# Patient Record
Sex: Male | Born: 1975 | Race: Black or African American | Hispanic: No | Marital: Single | Smoking: Current every day smoker
Health system: Southern US, Community
[De-identification: ages and names within clinical notes are randomized; demographics above are authoritative.]

---

## 1999-03-26 ENCOUNTER — Emergency Department (HOSPITAL_COMMUNITY): Admission: EM | Admit: 1999-03-26 | Discharge: 1999-03-26 | Payer: Self-pay

## 1999-04-12 ENCOUNTER — Emergency Department (HOSPITAL_COMMUNITY): Admission: EM | Admit: 1999-04-12 | Discharge: 1999-04-12 | Payer: Self-pay

## 1999-04-12 ENCOUNTER — Encounter: Payer: Self-pay | Admitting: Emergency Medicine

## 2000-10-14 ENCOUNTER — Emergency Department (HOSPITAL_COMMUNITY): Admission: EM | Admit: 2000-10-14 | Discharge: 2000-10-14 | Payer: Self-pay | Admitting: Emergency Medicine

## 2001-06-13 ENCOUNTER — Emergency Department (HOSPITAL_COMMUNITY): Admission: EM | Admit: 2001-06-13 | Discharge: 2001-06-13 | Payer: Self-pay | Admitting: Emergency Medicine

## 2002-08-02 ENCOUNTER — Emergency Department (HOSPITAL_COMMUNITY): Admission: EM | Admit: 2002-08-02 | Discharge: 2002-08-02 | Payer: Self-pay | Admitting: Emergency Medicine

## 2002-10-11 ENCOUNTER — Emergency Department (HOSPITAL_COMMUNITY): Admission: EM | Admit: 2002-10-11 | Discharge: 2002-10-11 | Payer: Self-pay | Admitting: Emergency Medicine

## 2005-05-07 ENCOUNTER — Emergency Department (HOSPITAL_COMMUNITY): Admission: EM | Admit: 2005-05-07 | Discharge: 2005-05-07 | Payer: Self-pay | Admitting: Emergency Medicine

## 2005-05-09 ENCOUNTER — Emergency Department (HOSPITAL_COMMUNITY): Admission: EM | Admit: 2005-05-09 | Discharge: 2005-05-09 | Payer: Self-pay | Admitting: Emergency Medicine

## 2005-05-10 ENCOUNTER — Emergency Department (HOSPITAL_COMMUNITY): Admission: EM | Admit: 2005-05-10 | Discharge: 2005-05-10 | Payer: Self-pay | Admitting: Emergency Medicine

## 2005-06-10 ENCOUNTER — Emergency Department (HOSPITAL_COMMUNITY): Admission: EM | Admit: 2005-06-10 | Discharge: 2005-06-10 | Payer: Self-pay | Admitting: Emergency Medicine

## 2005-11-21 ENCOUNTER — Emergency Department (HOSPITAL_COMMUNITY): Admission: EM | Admit: 2005-11-21 | Discharge: 2005-11-21 | Payer: Self-pay | Admitting: Emergency Medicine

## 2006-08-02 ENCOUNTER — Emergency Department (HOSPITAL_COMMUNITY): Admission: EM | Admit: 2006-08-02 | Discharge: 2006-08-03 | Payer: Self-pay | Admitting: Emergency Medicine

## 2006-08-06 ENCOUNTER — Emergency Department (HOSPITAL_COMMUNITY): Admission: EM | Admit: 2006-08-06 | Discharge: 2006-08-06 | Payer: Self-pay | Admitting: Family Medicine

## 2006-12-06 ENCOUNTER — Emergency Department (HOSPITAL_COMMUNITY): Admission: EM | Admit: 2006-12-06 | Discharge: 2006-12-06 | Payer: Self-pay | Admitting: Family Medicine

## 2006-12-21 ENCOUNTER — Encounter: Admission: RE | Admit: 2006-12-21 | Discharge: 2007-03-21 | Payer: Self-pay | Admitting: Orthopedic Surgery

## 2007-06-07 ENCOUNTER — Emergency Department (HOSPITAL_COMMUNITY): Admission: EM | Admit: 2007-06-07 | Discharge: 2007-06-07 | Payer: Self-pay | Admitting: Emergency Medicine

## 2007-10-01 ENCOUNTER — Emergency Department (HOSPITAL_COMMUNITY): Admission: EM | Admit: 2007-10-01 | Discharge: 2007-10-01 | Payer: Self-pay | Admitting: Emergency Medicine

## 2007-10-16 ENCOUNTER — Emergency Department (HOSPITAL_COMMUNITY): Admission: EM | Admit: 2007-10-16 | Discharge: 2007-10-16 | Payer: Self-pay | Admitting: Emergency Medicine

## 2007-12-25 ENCOUNTER — Emergency Department (HOSPITAL_COMMUNITY): Admission: EM | Admit: 2007-12-25 | Discharge: 2007-12-26 | Payer: Self-pay | Admitting: Emergency Medicine

## 2008-12-07 ENCOUNTER — Emergency Department (HOSPITAL_COMMUNITY): Admission: EM | Admit: 2008-12-07 | Discharge: 2008-12-07 | Payer: Self-pay | Admitting: Emergency Medicine

## 2009-03-14 ENCOUNTER — Emergency Department (HOSPITAL_COMMUNITY): Admission: EM | Admit: 2009-03-14 | Discharge: 2009-03-14 | Payer: Self-pay | Admitting: Emergency Medicine

## 2009-06-06 ENCOUNTER — Emergency Department (HOSPITAL_COMMUNITY): Admission: EM | Admit: 2009-06-06 | Discharge: 2009-06-06 | Payer: Self-pay | Admitting: Family Medicine

## 2009-08-11 ENCOUNTER — Emergency Department (HOSPITAL_COMMUNITY): Admission: EM | Admit: 2009-08-11 | Discharge: 2009-08-11 | Payer: Self-pay | Admitting: Emergency Medicine

## 2009-11-15 ENCOUNTER — Emergency Department (HOSPITAL_BASED_OUTPATIENT_CLINIC_OR_DEPARTMENT_OTHER): Admission: EM | Admit: 2009-11-15 | Discharge: 2009-11-15 | Payer: Self-pay | Admitting: Emergency Medicine

## 2010-10-14 LAB — URINALYSIS, ROUTINE W REFLEX MICROSCOPIC
Bilirubin Urine: NEGATIVE
Hgb urine dipstick: NEGATIVE
Nitrite: NEGATIVE
Specific Gravity, Urine: 1.025 (ref 1.005–1.030)
pH: 6 (ref 5.0–8.0)

## 2010-10-14 LAB — BASIC METABOLIC PANEL
CO2: 29 mEq/L (ref 19–32)
GFR calc Af Amer: >60 mL/min (ref >60)
GFR calc non Af Amer: >60 mL/min (ref >60)
Glucose, Bld: 86 mg/dL (ref 70–99)
Potassium: 4.5 mEq/L (ref 3.5–5.1)
Sodium: 145 mEq/L (ref 135–145)

## 2011-03-25 ENCOUNTER — Emergency Department (HOSPITAL_COMMUNITY): Payer: Self-pay

## 2011-03-25 ENCOUNTER — Emergency Department (HOSPITAL_COMMUNITY)
Admission: EM | Admit: 2011-03-25 | Discharge: 2011-03-25 | Disposition: A | Discharge status: Home / Self Care | Payer: Self-pay | Attending: Emergency Medicine | Admitting: Emergency Medicine

## 2011-03-25 DIAGNOSIS — M7020 Olecranon bursitis, unspecified elbow: Secondary | ICD-10-CM | POA: Insufficient documentation

## 2011-03-25 DIAGNOSIS — M25429 Effusion, unspecified elbow: Secondary | ICD-10-CM | POA: Insufficient documentation

## 2011-03-25 DIAGNOSIS — M25529 Pain in unspecified elbow: Secondary | ICD-10-CM | POA: Insufficient documentation

## 2011-05-05 LAB — URINALYSIS, ROUTINE W REFLEX MICROSCOPIC
Bilirubin Urine: NEGATIVE
Glucose, UA: NEGATIVE
Hgb urine dipstick: NEGATIVE
Ketones, ur: NEGATIVE
Nitrite: NEGATIVE
Protein, ur: NEGATIVE
Specific Gravity, Urine: 1.022
Urobilinogen, UA: 1
pH: 6

## 2011-07-15 ENCOUNTER — Encounter: Payer: Self-pay | Admitting: Emergency Medicine

## 2011-07-15 ENCOUNTER — Emergency Department (HOSPITAL_COMMUNITY)
Admission: EM | Admit: 2011-07-15 | Discharge: 2011-07-15 | Discharge status: Against Medical Advice | Payer: Self-pay | Attending: *Deleted | Admitting: *Deleted

## 2011-07-15 DIAGNOSIS — M25529 Pain in unspecified elbow: Secondary | ICD-10-CM | POA: Insufficient documentation

## 2011-07-15 NOTE — ED Notes (Signed)
Pt sts injured right elbow 4 months ago and still c/o pain and sometimes waking up with numbness in right hand; pt denies new injury; CMS intact

## 2011-07-16 ENCOUNTER — Encounter (HOSPITAL_BASED_OUTPATIENT_CLINIC_OR_DEPARTMENT_OTHER): Payer: Self-pay

## 2011-07-16 ENCOUNTER — Emergency Department (HOSPITAL_BASED_OUTPATIENT_CLINIC_OR_DEPARTMENT_OTHER)
Admission: EM | Admit: 2011-07-16 | Discharge: 2011-07-16 | Disposition: A | Discharge status: Home / Self Care | Payer: Self-pay | Attending: Emergency Medicine | Admitting: Emergency Medicine

## 2011-07-16 DIAGNOSIS — F17200 Nicotine dependence, unspecified, uncomplicated: Secondary | ICD-10-CM | POA: Insufficient documentation

## 2011-07-16 DIAGNOSIS — M25529 Pain in unspecified elbow: Secondary | ICD-10-CM | POA: Insufficient documentation

## 2011-07-16 DIAGNOSIS — M25521 Pain in right elbow: Secondary | ICD-10-CM

## 2011-07-16 DIAGNOSIS — J45909 Unspecified asthma, uncomplicated: Secondary | ICD-10-CM | POA: Insufficient documentation

## 2011-07-16 MED ORDER — OXYCODONE-ACETAMINOPHEN 5-325 MG PO TABS
1.0000 | ORAL_TABLET | Freq: Once | ORAL | Status: AC
  Administered 2011-07-16: 1 via ORAL
  Filled 2011-07-16: qty 1

## 2011-07-16 MED ORDER — IBUPROFEN 600 MG PO TABS: 600.0000 mg | ORAL_TABLET | Freq: Four times a day (QID) | ORAL | Status: AC | PRN

## 2011-07-16 MED ORDER — OXYCODONE-ACETAMINOPHEN 5-325 MG PO TABS: 1.0000 | ORAL_TABLET | Freq: Four times a day (QID) | ORAL | Status: AC | PRN

## 2011-07-16 NOTE — ED Notes (Signed)
Injured right elbow 4 months ago-had pus/fluid drained at that time-c/o cont'd pain-worse x 1 month

## 2011-07-16 NOTE — ED Provider Notes (Signed)
History     CSN: [id number]  Arrival date & time 07/16/11  1753   First MD Initiated Contact with Patient 07/16/11 1820      Chief Complaint  Patient presents with  . Elbow Pain    (Consider location/radiation/quality/duration/timing/severity/associated sxs/prior treatment) The history is provided by the patient.   patient had right elbow pain about 4 months ago. He is seen in the ER and that might work for one had some fluid drained. He states over the last month the pain is returned and gotten worse. No more swelling up. 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 She's less some tingling in his hand 2. No new trauma. No new injury. No swelling.  Past Medical History  Diagnosis Date  . Asthma     History reviewed. No pertinent past surgical history.  No family history on file.  History  Substance Use Topics  . Smoking status: Current Everyday Smoker  . Smokeless tobacco: Not on file  . Alcohol Use: No      Review of Systems  Musculoskeletal: Negative for back pain and joint swelling.  Neurological: Positive for numbness.    Allergies  Almond oil; Aspirin; and Peanut-containing drug products  Home Medications   Current Outpatient Rx  Name Route Sig Dispense Refill  . ACETAMINOPHEN 500 MG PO TABS Oral Take 1,000 mg by mouth every 6 (six) hours as needed. For pain      . ALBUTEROL SULFATE HFA 108 (90 BASE) MCG/ACT IN AERS Inhalation Inhale 2 puffs into the lungs every 6 (six) hours as needed. For wheezing      . IBUPROFEN 600 MG PO TABS Oral Take 1 tablet (600 mg total) by mouth every 6 (six) hours as needed for pain. 20 tablet 0  . OXYCODONE-ACETAMINOPHEN 5-325 MG PO TABS Oral Take 1-2 tablets by mouth every 6 (six) hours as needed for pain. 20 tablet 0    BP 125/86  Pulse 64  Temp(Src) 97.5 F (36.4 C) (Oral)  Resp 16  Ht 6\' 5"  (1.956 m)  Wt 230 lb (104.327 kg)  BMI 27.27 kg/m2  SpO2 100%  Physical Exam  Constitutional: He appears  well-developed.  HENT:  Head: Normocephalic.  Musculoskeletal:       Range of motion intact in right elbow. No swelling. No tenderness. No effusion. Neurovascular intact distally.    ED Course  Procedures (including critical care time)  Labs Reviewed - No data to display No results found.   1. Elbow pain, right       MDM  Patient has recurrent right elbow pain previously had fluid drained. Does not appear ill or fluid collection now. He'll follow up with orthopedic        Juliet Rude. Rubin Payor, MD 07/16/11 [phone]

## 2011-10-11 ENCOUNTER — Emergency Department (HOSPITAL_BASED_OUTPATIENT_CLINIC_OR_DEPARTMENT_OTHER)
Admission: EM | Admit: 2011-10-11 | Discharge: 2011-10-11 | Disposition: A | Discharge status: Home / Self Care | Payer: Self-pay | Attending: Emergency Medicine | Admitting: Emergency Medicine

## 2011-10-11 ENCOUNTER — Emergency Department (INDEPENDENT_AMBULATORY_CARE_PROVIDER_SITE_OTHER): Payer: Self-pay

## 2011-10-11 ENCOUNTER — Encounter (HOSPITAL_BASED_OUTPATIENT_CLINIC_OR_DEPARTMENT_OTHER): Payer: Self-pay | Admitting: *Deleted

## 2011-10-11 DIAGNOSIS — J45901 Unspecified asthma with (acute) exacerbation: Secondary | ICD-10-CM | POA: Insufficient documentation

## 2011-10-11 DIAGNOSIS — R05 Cough: Secondary | ICD-10-CM

## 2011-10-11 DIAGNOSIS — R079 Chest pain, unspecified: Secondary | ICD-10-CM | POA: Insufficient documentation

## 2011-10-11 DIAGNOSIS — R0602 Shortness of breath: Secondary | ICD-10-CM | POA: Insufficient documentation

## 2011-10-11 DIAGNOSIS — R059 Cough, unspecified: Secondary | ICD-10-CM | POA: Insufficient documentation

## 2011-10-11 DIAGNOSIS — J45909 Unspecified asthma, uncomplicated: Secondary | ICD-10-CM

## 2011-10-11 DIAGNOSIS — J069 Acute upper respiratory infection, unspecified: Secondary | ICD-10-CM | POA: Insufficient documentation

## 2011-10-11 MED ORDER — ALBUTEROL SULFATE (5 MG/ML) 0.5% IN NEBU
5.0000 mg | INHALATION_SOLUTION | Freq: Once | RESPIRATORY_TRACT | Status: AC
  Administered 2011-10-11: 5 mg via RESPIRATORY_TRACT
  Filled 2011-10-11: qty 1

## 2011-10-11 MED ORDER — IPRATROPIUM BROMIDE 0.02 % IN SOLN
0.5000 mg | Freq: Once | RESPIRATORY_TRACT | Status: AC
  Administered 2011-10-11: 0.5 mg via RESPIRATORY_TRACT
  Filled 2011-10-11: qty 2.5

## 2011-10-11 MED ORDER — ALBUTEROL SULFATE HFA 108 (90 BASE) MCG/ACT IN AERS
2.0000 | INHALATION_SPRAY | RESPIRATORY_TRACT | Status: DC | PRN
  Administered 2011-10-11: 2 via RESPIRATORY_TRACT
  Filled 2011-10-11: qty 6.7

## 2011-10-11 MED ORDER — PREDNISONE 50 MG PO TABS
60.0000 mg | ORAL_TABLET | Freq: Once | ORAL | Status: AC
  Administered 2011-10-11: 60 mg via ORAL
  Filled 2011-10-11: qty 1

## 2011-10-11 MED ORDER — PREDNISONE 20 MG PO TABS: 60.0000 mg | ORAL_TABLET | Freq: Every day | ORAL | Status: AC

## 2011-10-11 MED ORDER — AEROCHAMBER PLUS W/MASK LARGE MISC
1.0000 | Freq: Once | Status: DC
  Filled 2011-10-11: qty 1

## 2011-10-11 NOTE — Discharge Instructions (Signed)

## 2011-10-11 NOTE — ED Provider Notes (Signed)
History     CSN: [id number]  Arrival date & time 10/11/11  0305   First MD Initiated Contact with Patient 10/11/11 [phone]      Chief Complaint  Patient presents with  . Shortness of Breath  . URI    (Consider location/radiation/quality/duration/timing/severity/associated sxs/prior treatment) HPI Patient is a 36 year old male who presents today complaining of increasing shortness of breath, cough, and chest tightness consistent with prior asthma attacks over the past 4 days. Patient is completely out of his albuterol inhaler as he has been using it every 4 hours. Patient cannot recall the last time he took prednisone. Patient is a smoker. Patient does not see Dr. for his asthma on a regular basis. He denies any chest pain. He's had a nonproductive cough that has not improved with Mucinex. Patient denies any nausea, vomiting, abdominal pain, urinary symptoms, or fever. He does endorse having generalized fatigue.There are no other associated or modifying factors.  Past Medical History  Diagnosis Date  . Asthma     History reviewed. No pertinent past surgical history.  History reviewed. No pertinent family history.  History  Substance Use Topics  . Smoking status: Current Everyday Smoker  . Smokeless tobacco: Not on file  . Alcohol Use: No      Review of Systems  Constitutional: Positive for fatigue.  HENT: Positive for congestion.   Eyes: Negative.   Respiratory: Positive for choking, chest tightness and shortness of breath.   Cardiovascular: Negative.   Gastrointestinal: Negative.   Genitourinary: Negative.   Musculoskeletal: Negative.   Skin: Negative.   Neurological: Negative.   Hematological: Negative.   Psychiatric/Behavioral: Negative.   All other systems reviewed and are negative.    Allergies  Almond oil; Aspirin; and Peanut-containing drug products  Home Medications   Current Outpatient Rx  Name Route Sig Dispense Refill  . ACETAMINOPHEN 500 MG PO TABS  Oral Take 1,000 mg by mouth every 6 (six) hours as needed. For pain      . ALBUTEROL SULFATE HFA 108 (90 BASE) MCG/ACT IN AERS Inhalation Inhale 2 puffs into the lungs every 6 (six) hours as needed. For wheezing      . PREDNISONE 20 MG PO TABS Oral Take 3 tablets (60 mg total) by mouth daily. 15 tablet 0    BP 141/76  Pulse 58  Temp(Src) 98.1 F (36.7 C) (Oral)  Resp 19  Ht 6\' 5"  (1.956 m)  Wt 235 lb (106.595 kg)  BMI 27.87 kg/m2  SpO2 96%  Physical Exam  Nursing note and vitals reviewed. GEN: Well-developed, well-nourished male in no distress HEENT: Atraumatic, normocephalic. Oropharynx clear without erythema EYES: PERRLA BL, no scleral icterus. NECK: Trachea midline, no meningismus CV: regular rate and rhythm. No murmurs, rubs, or gallops PULM: No respiratory distress.  Diminished breath sounds throughout. No crackles, wheezes, or rales.  GI: soft, non-tender. No guarding, rebound, or tenderness. + bowel sounds  GU: deferred Neuro: cranial nerves 2-12 intact, no abnormalities of strength or sensation, A and O x 3 MSK: Patient moves all 4 extremities symmetrically, no deformity, edema, or injury noted Skin: No rashes petechiae, purpura, or jaundice Psych: no abnormality of mood   ED Course  Procedures (including critical care time)  Labs Reviewed - No data to display Dg Chest 2 View  10/11/2011  *RADIOLOGY REPORT*  Clinical Data: Cough, shortness of breath; history of asthma.  CHEST - 2 VIEW  Comparison: Chest radiograph performed 08/11/2009  Findings: The lungs are well-aerated.  Mild chronic  peribronchial thickening is noted.  There is no evidence of focal opacification, pleural effusion or pneumothorax.  The heart is normal in size; the mediastinal contour is within normal limits.  No acute osseous abnormalities are seen.  IMPRESSION: No acute cardiopulmonary process seen.  Original Report Authenticated By: Tonia Ghent, M.D.     1. URI (upper respiratory infection)     2. Asthma exacerbation       MDM  Patient was evaluated by myself. Patient was very diminished on initial exam. He was given breathing treatments with albuterol and Atrovent as well as prednisone. A chest x-ray was performed it was negative. Patient was reassessed following breathing treatment and had significantly improved air movement. Patient was given an albuterol inhaler with AeroChamber to take home. He will also be discharged to 5 days of prednisone. Patient was advised to stop smoking. He also was advised to followup with a regular doctor to determine if he needs to be on inhaled steroids on a regular basis. Patient was discharged home in good condition.        Cyndra Numbers, MD 10/11/11 [phone]

## 2011-10-11 NOTE — ED Notes (Signed)
Pt presents to ED today with URI sx and associated asthma for the last 4 days.

## 2011-10-11 NOTE — ED Notes (Signed)
MD at bedside. 

## 2011-10-11 NOTE — ED Notes (Signed)
Pt states that he has been sick with a cold but has an hx of asthma but his albuterol inhaler doesn't seem to be working. Pt states that he uses MDI Q4 2-3 puff with no relief. Pt does not appear to be SHOB. Pt states that he does smoke and doesn't have any maintenance inhaler to help support the asthma tx.

## 2012-07-21 ENCOUNTER — Emergency Department (HOSPITAL_COMMUNITY)
Admission: EM | Admit: 2012-07-21 | Discharge: 2012-07-21 | Disposition: A | Discharge status: Home / Self Care | Payer: Self-pay | Attending: Emergency Medicine | Admitting: Emergency Medicine

## 2012-07-21 ENCOUNTER — Encounter (HOSPITAL_COMMUNITY): Payer: Self-pay | Admitting: *Deleted

## 2012-07-21 ENCOUNTER — Emergency Department (HOSPITAL_COMMUNITY): Payer: Self-pay

## 2012-07-21 DIAGNOSIS — F17200 Nicotine dependence, unspecified, uncomplicated: Secondary | ICD-10-CM | POA: Insufficient documentation

## 2012-07-21 DIAGNOSIS — Z79899 Other long term (current) drug therapy: Secondary | ICD-10-CM | POA: Insufficient documentation

## 2012-07-21 DIAGNOSIS — R51 Headache: Secondary | ICD-10-CM | POA: Insufficient documentation

## 2012-07-21 DIAGNOSIS — J45901 Unspecified asthma with (acute) exacerbation: Secondary | ICD-10-CM | POA: Insufficient documentation

## 2012-07-21 DIAGNOSIS — Z2089 Contact with and (suspected) exposure to other communicable diseases: Secondary | ICD-10-CM | POA: Insufficient documentation

## 2012-07-21 DIAGNOSIS — J3489 Other specified disorders of nose and nasal sinuses: Secondary | ICD-10-CM | POA: Insufficient documentation

## 2012-07-21 MED ORDER — ALBUTEROL SULFATE HFA 108 (90 BASE) MCG/ACT IN AERS: 1.0000 | INHALATION_SPRAY | Freq: Four times a day (QID) | RESPIRATORY_TRACT | Status: DC | PRN

## 2012-07-21 MED ORDER — BENZONATATE 100 MG PO CAPS: 100.0000 mg | ORAL_CAPSULE | Freq: Three times a day (TID) | ORAL | Status: DC

## 2012-07-21 MED ORDER — PREDNISONE 50 MG PO TABS: 50.0000 mg | ORAL_TABLET | Freq: Every day | ORAL | Status: DC

## 2012-07-21 MED ORDER — METHYLPREDNISOLONE SODIUM SUCC 125 MG IJ SOLR
125.0000 mg | Freq: Once | INTRAMUSCULAR | Status: AC
  Administered 2012-07-21: 125 mg via INTRAVENOUS
  Filled 2012-07-21: qty 2

## 2012-07-21 MED ORDER — IPRATROPIUM BROMIDE 0.02 % IN SOLN
0.5000 mg | Freq: Once | RESPIRATORY_TRACT | Status: AC
  Administered 2012-07-21: 0.5 mg via RESPIRATORY_TRACT
  Filled 2012-07-21: qty 2.5

## 2012-07-21 MED ORDER — ALBUTEROL SULFATE (5 MG/ML) 0.5% IN NEBU
5.0000 mg | INHALATION_SOLUTION | Freq: Once | RESPIRATORY_TRACT | Status: AC
  Administered 2012-07-21: 5 mg via RESPIRATORY_TRACT
  Filled 2012-07-21: qty 1

## 2012-07-21 NOTE — ED Notes (Signed)
MD at bedside. 

## 2012-07-21 NOTE — ED Provider Notes (Signed)
History  This chart was scribed for Loren Racer, MD by Bennett Scrape, ED Scribe. This patient was seen in room TR09C/TR09C and the patient's care was started at 1:53 PM.  CSN: [id number]  Arrival date & time 07/21/12  1339   First MD Initiated Contact with Patient 07/21/12 1353      Chief Complaint  Patient presents with  . Asthma    The history is provided by the patient. No language interpreter was used.    Richard Nielsen is a 36 y.o. male with a h/o asthma who presents to the Emergency Department complaining of 6 days of gradual onset, gradually worsening, constant cough productive of yellow phlegm with associated congestion, HA and wheezing that he attributes to asthma excerbation. He reports that he took 3 steroid pills left over from a prior prescription with no improvement in his symptoms and ran out of his inhaler 4 days ago. He states that the symptoms are similar to prior asthma triggers. He reports having several sick contacts with similar symptoms at home. He denies fevers, chills, nausea, emesis and diarrhea as associated symptoms. He is a current everyday smoker but denies alcohol use.    Past Medical History  Diagnosis Date  . Asthma     History reviewed. No pertinent past surgical history.  History reviewed. No pertinent family history.  History  Substance Use Topics  . Smoking status: Current Every Day Smoker  . Smokeless tobacco: Not on file  . Alcohol Use: No      Review of Systems  Constitutional: Negative for fever and chills.  HENT: Positive for congestion. Negative for sore throat.   Respiratory: Positive for cough and wheezing. Negative for chest tightness and shortness of breath.   Neurological: Positive for headaches. Negative for dizziness.  All other systems reviewed and are negative.    Allergies  Almond oil; Aspirin; and Peanut-containing drug products  Home Medications   Current Outpatient Rx  Name  Route  Sig  Dispense   Refill  . ALBUTEROL SULFATE HFA 108 (90 BASE) MCG/ACT IN AERS   Inhalation   Inhale 2 puffs into the lungs once. For wheezing          . ALBUTEROL SULFATE HFA 108 (90 BASE) MCG/ACT IN AERS   Inhalation   Inhale 1-2 puffs into the lungs every 6 (six) hours as needed for wheezing.   1 Inhaler   0   . BENZONATATE 100 MG PO CAPS   Oral   Take 1 capsule (100 mg total) by mouth every 8 (eight) hours.   21 capsule   0   . PREDNISONE 50 MG PO TABS   Oral   Take 1 tablet (50 mg total) by mouth daily.   5 tablet   0     Triage Vitals: BP 155/83  Pulse 75  Temp 98.3 F (36.8 C) (Oral)  Resp 24  Ht 6\' 5"  (1.956 m)  SpO2 96%  Physical Exam  Nursing note and vitals reviewed. Constitutional: He is oriented to person, place, and time. He appears well-developed and well-nourished. No distress.  HENT:  Head: Normocephalic and atraumatic.  Mouth/Throat: Oropharynx is clear and moist.  Eyes: Conjunctivae normal and EOM are normal.  Neck: Normal range of motion. Neck supple. No tracheal deviation present.  Cardiovascular: Normal rate and regular rhythm.   Pulmonary/Chest: Effort normal. No respiratory distress. He has wheezes (expiratory).  Musculoskeletal: Normal range of motion.  Neurological: He is alert and oriented to person, place,  and time.  Skin: Skin is warm and dry.  Psychiatric: He has a normal mood and affect. His behavior is normal.    ED Course  Procedures (including critical care time)  DIAGNOSTIC STUDIES: Oxygen Saturation is 96% on room air, adequate by my interpretation.    COORDINATION OF CARE: 1:58 PM- Discussed treatment plan which includes CXR and a breathing treatment with pt at bedside and pt agreed to plan.  2:15 PM- Ordered 125 mg solumedrol injection, 5 mg of 0.5% albuterol nebulizer solution and 0.5 mg of Atrovent solution.  4:15 PM- Pt rechecked and feels improved. Upon re-exam, lungs are clear to ausculation. Advised pt of radiology results.  Discussed discharge plan which includes prednisone, albuterol inhaler and tessalon with pt and pt agreed to plan. Also advised pt to follow up with PCP and pt agreed.  4:20 PM- Prescribed 50 mg prednisone tablets, albuterol inhaler and 100 mg tessalon capsules.  .Labs Reviewed - No data to display Dg Chest 2 View  07/21/2012  *RADIOLOGY REPORT*  Clinical Data: Shortness of breath, productive cough  CHEST - 2 VIEW  Comparison: 10/11/2011  Findings: The heart size and vascular pattern are normal.  Lungs are clear.  There is no change from prior study.  IMPRESSION: Normal chest radiographs.   Original Report Authenticated By: Esperanza Heir, M.D.      1. Asthma exacerbation       MDM  I personally performed the services described in this documentation, which was scribed in my presence. The recorded information has been reviewed and is accurate.    Loren Racer, MD 07/21/12 1944

## 2012-07-21 NOTE — ED Notes (Signed)
t has hx of asthma.  Reports that he is having a flare up.  Pt speaking incomplete sentences, breathing nonlabored.  Reports that he hasnt had an inhaler is 6 months.  Pt noted to be coughing. States that he has had some congestion and HA.  NAD noted.

## 2013-11-01 ENCOUNTER — Emergency Department (HOSPITAL_COMMUNITY): Payer: Self-pay

## 2013-11-01 ENCOUNTER — Emergency Department (HOSPITAL_COMMUNITY)
Admission: EM | Admit: 2013-11-01 | Discharge: 2013-11-01 | Disposition: A | Discharge status: Home / Self Care | Payer: Self-pay | Attending: Emergency Medicine | Admitting: Emergency Medicine

## 2013-11-01 ENCOUNTER — Encounter (HOSPITAL_COMMUNITY): Payer: Self-pay | Admitting: Emergency Medicine

## 2013-11-01 DIAGNOSIS — Z79899 Other long term (current) drug therapy: Secondary | ICD-10-CM | POA: Insufficient documentation

## 2013-11-01 DIAGNOSIS — S62009A Unspecified fracture of navicular [scaphoid] bone of unspecified wrist, initial encounter for closed fracture: Secondary | ICD-10-CM | POA: Insufficient documentation

## 2013-11-01 DIAGNOSIS — F17200 Nicotine dependence, unspecified, uncomplicated: Secondary | ICD-10-CM | POA: Insufficient documentation

## 2013-11-01 DIAGNOSIS — S6990XA Unspecified injury of unspecified wrist, hand and finger(s), initial encounter: Secondary | ICD-10-CM | POA: Insufficient documentation

## 2013-11-01 DIAGNOSIS — R296 Repeated falls: Secondary | ICD-10-CM | POA: Insufficient documentation

## 2013-11-01 DIAGNOSIS — S8263XA Displaced fracture of lateral malleolus of unspecified fibula, initial encounter for closed fracture: Secondary | ICD-10-CM | POA: Insufficient documentation

## 2013-11-01 DIAGNOSIS — Y9239 Other specified sports and athletic area as the place of occurrence of the external cause: Secondary | ICD-10-CM | POA: Insufficient documentation

## 2013-11-01 DIAGNOSIS — S6980XA Other specified injuries of unspecified wrist, hand and finger(s), initial encounter: Secondary | ICD-10-CM | POA: Insufficient documentation

## 2013-11-01 DIAGNOSIS — S8253XA Displaced fracture of medial malleolus of unspecified tibia, initial encounter for closed fracture: Secondary | ICD-10-CM | POA: Insufficient documentation

## 2013-11-01 DIAGNOSIS — Y9367 Activity, basketball: Secondary | ICD-10-CM | POA: Insufficient documentation

## 2013-11-01 DIAGNOSIS — Y92838 Other recreation area as the place of occurrence of the external cause: Secondary | ICD-10-CM

## 2013-11-01 DIAGNOSIS — J45909 Unspecified asthma, uncomplicated: Secondary | ICD-10-CM | POA: Insufficient documentation

## 2013-11-01 MED ORDER — ONDANSETRON 4 MG PO TBDP
4.0000 mg | ORAL_TABLET | Freq: Once | ORAL | Status: AC
  Administered 2013-11-01: 4 mg via ORAL
  Filled 2013-11-01: qty 1

## 2013-11-01 MED ORDER — OXYCODONE-ACETAMINOPHEN 5-325 MG PO TABS: 1.0000 | ORAL_TABLET | Freq: Four times a day (QID) | ORAL | Status: DC | PRN

## 2013-11-01 MED ORDER — NAPROXEN 500 MG PO TABS: 500.0000 mg | ORAL_TABLET | Freq: Two times a day (BID) | ORAL | Status: DC

## 2013-11-01 MED ORDER — OXYCODONE-ACETAMINOPHEN 5-325 MG PO TABS
1.0000 | ORAL_TABLET | Freq: Once | ORAL | Status: AC
Start: 2013-11-01 — End: 2013-11-01
  Administered 2013-11-01: 1 via ORAL
  Filled 2013-11-01: qty 1

## 2013-11-01 NOTE — ED Notes (Signed)
Ortho at bedside.

## 2013-11-01 NOTE — ED Notes (Signed)
Ortho paged. 

## 2013-11-01 NOTE — ED Notes (Signed)
Ortho at bedside to place splint and cam walker.

## 2013-11-01 NOTE — ED Provider Notes (Signed)
CSN: [id number]     Arrival date & time 11/01/13  1059 History   First MD Initiated Contact with Patient 11/01/13 1112    This chart was scribed for Richard Nielsen, a non-physician practitioner working with No att. providers found by Lewanda Rife, ED Scribe. This patient was seen in room TR08C/TR08C and the patient's care was started at 7:03 PM     Chief Complaint  Patient presents with  . Ankle Pain  . Foot Pain     (Consider location/radiation/quality/duration/timing/severity/associated sxs/prior Treatment) The history is provided by the patient. No language interpreter was used.   HPI Comments: Richard Nielsen is a 38 y.o. male who presents to the Emergency Department complaining of constant right ankle pain onset last night while playing basketball with his son. He also remembers breaking his fall with right hand. Describes pain as moderate in severity. States he was playing basketball when his son last night and son stepped onto his right foot causing him to fall. Reports associate right foot pain, right wrist pain, and antalgic gait. States pain is exacerbated by touch, gripping motion of right hand, and movement. Denies any alleviating factors. Denies associated numbness, weakness, head injury, and LOC.  Past Medical History  Diagnosis Date  . Asthma    History reviewed. No pertinent past surgical history. No family history on file. History  Substance Use Topics  . Smoking status: Current Every Day Smoker -- 0.50 packs/day    Types: Cigarettes  . Smokeless tobacco: Not on file  . Alcohol Use: Yes    Review of Systems  Constitutional: Negative for fever.  Musculoskeletal: Positive for myalgias.  Neurological: Negative for weakness and numbness.  Psychiatric/Behavioral: Negative for confusion.      Allergies  Almond oil; Aspirin; and Peanut-containing drug products  Home Medications   Current Outpatient Rx  Name  Route  Sig  Dispense  Refill  . ibuprofen  (ADVIL,MOTRIN) 800 MG tablet   Oral   Take 800 mg by mouth every 8 (eight) hours as needed (pain).         Marland Kitchen albuterol (PROVENTIL HFA;VENTOLIN HFA) 108 (90 BASE) MCG/ACT inhaler   Inhalation   Inhale 1-2 puffs into the lungs every 6 (six) hours as needed (asthma). For wheezing          . diphenhydramine-acetaminophen (TYLENOL PM) 25-500 MG TABS   Oral   Take 2 tablets by mouth once as needed (pain).         . naproxen (NAPROSYN) 500 MG tablet   Oral   Take 1 tablet (500 mg total) by mouth 2 (two) times daily.   30 tablet   0   . oxyCODONE-acetaminophen (PERCOCET/ROXICET) 5-325 MG per tablet   Oral   Take 1-2 tablets by mouth every 6 (six) hours as needed for severe pain.   25 tablet   0    BP 118/75  Pulse 101  Temp(Src) 98.4 F (36.9 C) (Oral)  Resp 18  Ht 6\' 5"  (1.956 m)  Wt 225 lb (102.059 kg)  BMI 26.68 kg/m2  SpO2 98% Physical Exam  Nursing note and vitals reviewed. Constitutional: He is oriented to person, place, and time. He appears well-developed and well-nourished. No distress.  HENT:  Head: Normocephalic and atraumatic.  Eyes: EOM are normal.  Neck: Neck supple. No tracheal deviation present.  Cardiovascular: Normal rate.   Pulmonary/Chest: Effort normal. No respiratory distress.  Musculoskeletal: Normal range of motion.  Right hand: decreased ROM of right  thumb  secondary to pain, swelling over thenar eminence, full ROM of finger 2-5, neurovascularly intact in all five fingers. No pain or decreased ROM right wrist. Right elbow and right shoulder is normal.   Right ankle: Full ROM with pain, diffuse swelling, neurovascularly intact to all five toes, and no swelling of calf  Neurological: He is alert and oriented to person, place, and time.  Skin: Skin is warm and dry.  Psychiatric: He has a normal mood and affect. His behavior is normal.    ED Course  Procedures (including critical care time) COORDINATION OF CARE:  Nursing notes reviewed. Vital  signs reviewed. Initial pt interview and examination performed.   Filed Vitals:   11/01/13 1119 11/01/13 1349  BP: 127/72 118/75  Pulse: 50 101  Temp: 98.4 F (36.9 C)   TempSrc: Oral   Resp: 18   Height: 6\' 5"  (1.956 m)   Weight: 225 lb (102.059 kg)   SpO2: 98% 98%    7:03 PM-Discussed work up plan with pt at bedside, which includes  Orders Placed This Encounter  Procedures  . DG Ankle Complete Right    Standing Status: Standing     Number of Occurrences: 1     Standing Expiration Date:     Order Specific Question:  Reason for exam:    Answer:  ANKLE PAIN    Order Specific Question:  Reason for exam:    Answer:  FOOT PAIN  . DG Wrist Complete Right    Standing Status: Standing     Number of Occurrences: 1     Standing Expiration Date:     Order Specific Question:  Reason for exam:    Answer:  ANKLE PAIN    Order Specific Question:  Reason for exam:    Answer:  FOOT PAIN  . DG Foot Complete Right    Standing Status: Standing     Number of Occurrences: 1     Standing Expiration Date:     Order Specific Question:  Reason for exam:    Answer:  ANKLE PAIN    Order Specific Question:  Reason for exam:    Answer:  FOOT PAIN  . Apply cam walker    Standing Status: Standing     Number of Occurrences: 1     Standing Expiration Date:     Order Specific Question:  Laterality    Answer:  Right  . Apply splint short arm    THUMB SPICA    Standing Status: Standing     Number of Occurrences: 1     Standing Expiration Date:     Order Specific Question:  Laterality    Answer:  Right  . Pt agrees with plan.   Treatment plan initiated: Medications  oxyCODONE-acetaminophen (PERCOCET/ROXICET) 5-325 MG per tablet 1 tablet (1 tablet Oral Given 11/01/13 1324)  ondansetron (ZOFRAN-ODT) disintegrating tablet 4 mg (4 mg Oral Given 11/01/13 1324)     Initial diagnostic testing ordered.       Labs Review Labs Reviewed - No data to display Imaging Review Dg Wrist Complete  Right  11/01/2013   CLINICAL DATA:  Pain post trauma  EXAM: RIGHT WRIST - COMPLETE 3+ VIEW  COMPARISON:  None.  FINDINGS: Frontal, oblique, lateral, and ulnar deviation scaphoid images were obtained. On the oblique view, there is a fracture along the lateral distal scaphoid in near anatomic alignment. No other fracture. No dislocation. Joint spaces appear intact.  IMPRESSION: Fracture along the lateral aspect of the distal scaphoid  bone. No other abnormality noted.   Electronically Signed   By: Bretta Bang M.D.   On: 11/01/2013 12:38   Dg Ankle Complete Right  11/01/2013   CLINICAL DATA:  Pain post trauma  EXAM: RIGHT ANKLE - COMPLETE 3+ VIEW  COMPARISON:  None.  FINDINGS: Frontal, oblique, and lateral views were obtained. There is generalized soft tissue swelling. There is a small avulsion arising off the medial aspect of the medial malleolus. There is also an avulsion arising from the inferior aspect of the lateral malleolus.  No other fractures are appreciated. Ankle mortise appears intact. No appreciable joint effusion.  IMPRESSION: Small avulsions arising from the medial and lateral malleolar regions. Mortise appears intact.   Electronically Signed   By: Bretta Bang M.D.   On: 11/01/2013 12:36   Dg Foot Complete Right  11/01/2013   CLINICAL DATA:  Pain post trauma  EXAM: RIGHT FOOT COMPLETE - 3+ VIEW  COMPARISON:  None.  FINDINGS: Frontal, oblique, and lateral views were obtained. There is flexion of the second PIP and DIP joints. There is no frank dislocation. No fracture. There is no appreciable joint space narrowing. No erosive change.  IMPRESSION: Persistent flexion of the second PIP and DIP joints. No acute fracture or dislocation.   Electronically Signed   By: Bretta Bang M.D.   On: 11/01/2013 12:37   7:03 PM Nursing Notes Reviewed/ Care Coordinated Applicable Imaging Reviewed and incorporated into ED treatment Discussed results and treatment plan with pt.  Pt demonstrates  understanding and agrees with plan.  7:03 PM Consulted with Dr. Dion Saucier who agrees with plan of cam walker for right foot and thumb spica for right hand. States he will manage scaphoid fracture initially, but will refer to hand if he feels it is more complex.   EKG Interpretation None      MDM   Final diagnoses:  Scaphoid fracture of wrist  Lateral malleolar fracture  Medial malleolar fracture   38 y.o.Salvatore Decent Henion's evaluation in the Emergency Department is complete. It has been determined that no acute conditions requiring further emergency intervention are present at this time. The patient/guardian have been advised of the diagnosis and plan. We have discussed signs and symptoms that warrant return to the ED, such as changes or worsening in symptoms.  Vital signs are stable at discharge. Filed Vitals:   11/01/13 1349  BP: 118/75  Pulse: 101  Temp:   Resp:     Patient/guardian has voiced understanding and agreed to follow-up with the PCP or specialist.  I personally performed the services described in this documentation, which was scribed in my presence. The recorded information has been reviewed and is accurate.   Dorthula Matas, PA-C 11/01/13 1904

## 2013-11-01 NOTE — ED Notes (Signed)
Patient states he was playing basket ball with his son last night and he landed wrong on the R foot/ankle.  He states his son fell on it.   When breaking the fall, he hurt his R wrist also.   Swelling noted to both.

## 2013-11-01 NOTE — Progress Notes (Signed)
Orthopedic Tech Progress Note Patient Details:  Richard HartsSidney B Nielsen Dec 16, 1975 [id number]  Ortho Devices Type of Ortho Device: CAM walker;Thumb spica splint Ortho Device/Splint Location: rue/rle Ortho Device/Splint Interventions: Application   Richard Nielsen 11/01/2013, 1:44 PM

## 2013-11-01 NOTE — ED Provider Notes (Signed)
Medical screening examination/treatment/procedure(s) were performed by non-physician practitioner and as supervising physician I was immediately available for consultation/collaboration.   EKG Interpretation None        Junius ArgyleForrest S Samik Balkcom, MD 11/01/13 2144

## 2013-11-01 NOTE — Discharge Instructions (Signed)
Ankle Fracture A fracture is a break in the bone. A cast or splint is used to protect and keep your injured bone from moving.  HOME CARE INSTRUCTIONS   Use your crutches as directed.  To lessen the swelling, keep the injured leg elevated while sitting or lying down.  Apply ice to the injury for 15-20 minutes, 03-04 times per day while awake for 2 days. Put the ice in a plastic bag and place a thin towel between the bag of ice and your cast.  If you have a plaster or fiberglass cast:  Do not try to scratch the skin under the cast using sharp or pointed objects.  Check the skin around the cast every day. You may put lotion on any red or sore areas.  Keep your cast dry and clean.  If you have a plaster splint:  Wear the splint as directed.  You may loosen the elastic around the splint if your toes become numb, tingle, or turn cold or blue.  Do not put pressure on any part of your cast or splint; it may break. Rest your cast only on a pillow the first 24 hours until it is fully hardened.  Your cast or splint can be protected during bathing with a plastic bag. Do not lower the cast or splint into water.  Take medications as directed by your caregiver. Only take over-the-counter or prescription medicines for pain, discomfort, or fever as directed by your caregiver.  Do not drive a vehicle until your caregiver specifically tells you it is safe to do so.  If your caregiver has given you a follow-up appointment, it is very important to keep that appointment. Not keeping the appointment could result in a chronic or permanent injury, pain, and disability. If there is any problem keeping the appointment, you must call back to this facility for assistance. SEEK IMMEDIATE MEDICAL CARE IF:   Your cast gets damaged or breaks.  You have continued severe pain or more swelling than you did before the cast was put on.  Your skin or toenails below the injury turn blue or gray, or feel cold or  numb.  There is a bad smell or new stains and/or purulent (pus like) drainage coming from under the cast. If you do not have a window in your cast for observing the wound, a discharge or minor bleeding may show up as a stain on the outside of your cast. Report these findings to your caregiver. MAKE SURE YOU:   Understand these instructions.  Will watch your condition.  Will get help right away if you are not doing well or get worse. Document Released: 07/10/2000 Document Revised: 10/05/2011 Document Reviewed: 02/09/2013 [facility] Patient Information 2014 [place], [place].    Cast or Splint Care Casts and splints support injured limbs and keep bones from moving while they heal. It is important to care for your cast or splint at home.  HOME CARE INSTRUCTIONS  Keep the cast or splint uncovered during the drying period. It can take 24 to 48 hours to dry if it is made of plaster. A fiberglass cast will dry in less than 1 hour.  Do not rest the cast on anything harder than a pillow for the first 24 hours.  Do not put weight on your injured limb or apply pressure to the cast until your health care provider gives you permission.  Keep the cast or splint dry. Wet casts or splints can lose their shape and may not support  the limb as well. A wet cast that has lost its shape can also create harmful pressure on your skin when it dries. Also, wet skin can become infected.  Cover the cast or splint with a plastic bag when bathing or when out in the rain or snow. If the cast is on the trunk of the body, take sponge baths until the cast is removed.  If your cast does become wet, dry it with a towel or a blow dryer on the cool setting only.  Keep your cast or splint clean. Soiled casts may be wiped with a moistened cloth.  Do not place any hard or soft foreign objects under your cast or splint, such as cotton, toilet paper, lotion, or powder.  Do not try to scratch the skin under the cast with any  object. The object could get stuck inside the cast. Also, scratching could lead to an infection. If itching is a problem, use a blow dryer on a cool setting to relieve discomfort.  Do not trim or cut your cast or remove padding from inside of it.  Exercise all joints next to the injury that are not immobilized by the cast or splint. For example, if you have a long leg cast, exercise the hip joint and toes. If you have an arm cast or splint, exercise the shoulder, elbow, thumb, and fingers.  Elevate your injured arm or leg on 1 or 2 pillows for the first 1 to 3 days to decrease swelling and pain.It is best if you can comfortably elevate your cast so it is higher than your heart. SEEK MEDICAL CARE IF:   Your cast or splint cracks.  Your cast or splint is too tight or too loose.  You have unbearable itching inside the cast.  Your cast becomes wet or develops a soft spot or area.  You have a bad smell coming from inside your cast.  You get an object stuck under your cast.  Your skin around the cast becomes red or raw.  You have new pain or worsening pain after the cast has been applied. SEEK IMMEDIATE MEDICAL CARE IF:   You have fluid leaking through the cast.  You are unable to move your fingers or toes.  You have discolored (blue or white), cool, painful, or very swollen fingers or toes beyond the cast.  You have tingling or numbness around the injured area.  You have severe pain or pressure under the cast.  You have any difficulty with your breathing or have shortness of breath.  You have chest pain. Document Released: 07/10/2000 Document Revised: 05/03/2013 Document Reviewed: 01/19/2013 [facility] Patient Information 2014 [place], [place].   Scaphoid Fracture, Wrist A fracture is a break in the bone. The bone you have broken often does not show up as a fracture on x-ray until later on in the healing phase. This bone is called the scaphoid bone. With this bone, your caregiver  will often cast or splint your wrist as though it is fractured, even if a fracture is not seen on the x-ray. This is often done with wrist injuries in which there is tenderness at the base of the thumb. An x-ray at 1-3 weeks after your injury may confirm this fracture. A cast or splint is used to protect and keep your injured bone in good position for healing. The cast or splint will be on generally for about 6 to 16 weeks, depending on your health, age, the fracture location and how quickly you  heal. Another name for the scaphoid bone is the navicular bone. HOME CARE INSTRUCTIONS   To lessen the swelling and pain, keep the injured part elevated above your heart while sitting or lying down.  Apply ice to the injury for 15-20 minutes, 03-04 times per day while awake, for 2 days. Put the ice in a plastic bag and place a thin towel between the bag of ice and your cast.  If you have a plaster or fiberglass cast or splint:  Do not try to scratch the skin under the cast using sharp or pointed objects.  Check the skin around the cast every day. You may put lotion on any red or sore areas.  Keep your cast or splint dry and clean.  If you have a plaster splint:  Wear the splint as directed.  You may loosen the elastic bandage around the splint if your fingers become numb, tingle, or turn cold or blue.  If you have been put in a removable splint, wear and use as directed.  Do not put pressure on any part of your cast or splint; it may deform or break. Rest your cast or splint only on a pillow the first 24 hours until it is fully hardened.  Your cast or splint can be protected during bathing with a plastic bag. Do not lower the cast or splint into water.  Only take over-the-counter or prescription medicines for pain, discomfort, or fever as directed by your caregiver.  If your caregiver has given you a follow up appointment, it is very important to keep that appointment. Not keeping the appointment  could result in chronic pain and decreased function. If there is any problem keeping the appointment, you must call back to this facility for assistance. SEEK IMMEDIATE MEDICAL CARE IF:   Your cast gets damaged, wet or breaks.  You have continued severe pain or more swelling than you did before the cast or splint was put on.  Your skin or nails below the injury turn blue or gray, or feel cold or numb.  You have tingling or burning pain in your fingers or increasing pain with movement of your fingers Document Released: 07/03/2002 Document Revised: 10/05/2011 Document Reviewed: 03/01/2009 [facility] Patient Information 2014 [place].

## 2013-11-01 NOTE — ED Notes (Signed)
Tiffany PA at bedside  

## 2015-01-13 ENCOUNTER — Emergency Department (HOSPITAL_COMMUNITY): Payer: Self-pay

## 2015-01-13 ENCOUNTER — Emergency Department (HOSPITAL_COMMUNITY)
Admission: EM | Admit: 2015-01-13 | Discharge: 2015-01-13 | Disposition: A | Discharge status: Home / Self Care | Payer: Self-pay | Attending: Emergency Medicine | Admitting: Emergency Medicine

## 2015-01-13 ENCOUNTER — Encounter (HOSPITAL_COMMUNITY): Payer: Self-pay | Admitting: Emergency Medicine

## 2015-01-13 DIAGNOSIS — N201 Calculus of ureter: Secondary | ICD-10-CM

## 2015-01-13 DIAGNOSIS — J45909 Unspecified asthma, uncomplicated: Secondary | ICD-10-CM | POA: Insufficient documentation

## 2015-01-13 DIAGNOSIS — N132 Hydronephrosis with renal and ureteral calculous obstruction: Secondary | ICD-10-CM | POA: Insufficient documentation

## 2015-01-13 DIAGNOSIS — Z720 Tobacco use: Secondary | ICD-10-CM | POA: Insufficient documentation

## 2015-01-13 LAB — URINALYSIS, ROUTINE W REFLEX MICROSCOPIC
Bilirubin Urine: NEGATIVE
Glucose, UA: NEGATIVE mg/dL
Ketones, ur: NEGATIVE mg/dL
NITRITE: NEGATIVE
PH: 7.5 (ref 5.0–8.0)
PROTEIN: 30 mg/dL — AB
SPECIFIC GRAVITY, URINE: 1.046 — AB (ref 1.005–1.030)
Urobilinogen, UA: 0.2 mg/dL (ref 0.0–1.0)

## 2015-01-13 LAB — CBC WITH DIFFERENTIAL/PLATELET
BASOS PCT: 0 % (ref 0–1)
Basophils Absolute: 0 10*3/uL (ref 0.0–0.1)
EOS ABS: 0.2 10*3/uL (ref 0.0–0.7)
Eosinophils Relative: 2 % (ref 0–5)
HEMATOCRIT: 44.9 % (ref 39.0–52.0)
Hemoglobin: 16.4 g/dL (ref 13.0–17.0)
Lymphocytes Relative: 38 % (ref 12–46)
Lymphs Abs: 4.1 10*3/uL — ABNORMAL HIGH (ref 0.7–4.0)
MCH: 32.5 pg (ref 26.0–34.0)
MCHC: 36.5 g/dL — AB (ref 30.0–36.0)
MCV: 88.9 fL (ref 78.0–100.0)
Monocytes Absolute: 1 10*3/uL (ref 0.1–1.0)
Monocytes Relative: 9 % (ref 3–12)
Neutro Abs: 5.5 10*3/uL (ref 1.7–7.7)
Neutrophils Relative %: 51 % (ref 43–77)
Platelets: 171 10*3/uL (ref 150–400)
RBC: 5.05 MIL/uL (ref 4.22–5.81)
RDW: 13.6 % (ref 11.5–15.5)
WBC: 10.7 10*3/uL — AB (ref 4.0–10.5)

## 2015-01-13 LAB — COMPREHENSIVE METABOLIC PANEL
ALK PHOS: 65 U/L (ref 38–126)
ALT: 18 U/L (ref 17–63)
AST: 30 U/L (ref 15–41)
Albumin: 3.9 g/dL (ref 3.5–5.0)
Anion gap: 10 (ref 5–15)
BILIRUBIN TOTAL: 0.6 mg/dL (ref 0.3–1.2)
BUN: 11 mg/dL (ref 6–20)
CHLORIDE: 106 mmol/L (ref 101–111)
CO2: 23 mmol/L (ref 22–32)
CREATININE: 1.15 mg/dL (ref 0.61–1.24)
Calcium: 9.3 mg/dL (ref 8.9–10.3)
Glucose, Bld: 130 mg/dL — ABNORMAL HIGH (ref 65–99)
POTASSIUM: 3.6 mmol/L (ref 3.5–5.1)
SODIUM: 139 mmol/L (ref 135–145)
Total Protein: 7.6 g/dL (ref 6.5–8.1)

## 2015-01-13 LAB — URINE MICROSCOPIC-ADD ON

## 2015-01-13 LAB — LIPASE, BLOOD: LIPASE: 34 U/L (ref 22–51)

## 2015-01-13 MED ORDER — OXYCODONE-ACETAMINOPHEN 5-325 MG PO TABS: 2.0000 | ORAL_TABLET | ORAL | Status: DC | PRN

## 2015-01-13 MED ORDER — HYDROMORPHONE HCL 1 MG/ML IJ SOLN
1.0000 mg | Freq: Once | INTRAMUSCULAR | Status: AC
  Administered 2015-01-13: 1 mg via INTRAVENOUS
  Filled 2015-01-13: qty 1

## 2015-01-13 MED ORDER — IOHEXOL 300 MG/ML  SOLN
100.0000 mL | Freq: Once | INTRAMUSCULAR | Status: AC | PRN
  Administered 2015-01-13: 100 mL via INTRAVENOUS

## 2015-01-13 MED ORDER — SODIUM CHLORIDE 0.9 % IV BOLUS (SEPSIS)
1000.0000 mL | Freq: Once | INTRAVENOUS | Status: AC
  Administered 2015-01-13: 1000 mL via INTRAVENOUS

## 2015-01-13 MED ORDER — IOHEXOL 300 MG/ML  SOLN: 25.0000 mL | Freq: Once | INTRAMUSCULAR | Status: DC | PRN

## 2015-01-13 MED ORDER — ONDANSETRON HCL 4 MG/2ML IJ SOLN
4.0000 mg | Freq: Once | INTRAMUSCULAR | Status: AC
  Administered 2015-01-13: 4 mg via INTRAVENOUS
  Filled 2015-01-13: qty 2

## 2015-01-13 MED ORDER — ONDANSETRON HCL 4 MG PO TABS: 4.0000 mg | ORAL_TABLET | Freq: Three times a day (TID) | ORAL | Status: DC | PRN

## 2015-01-13 NOTE — ED Provider Notes (Signed)
CSN: [id number]     Arrival date & time 01/13/15  0442 History   First MD Initiated Contact with Patient 01/13/15 0600     Chief Complaint  Patient presents with  . Abdominal Pain     (Consider location/radiation/quality/duration/timing/severity/associated sxs/prior Treatment) The history is provided by the patient.     Pt p/w RLQ pain, N/V/D that initially began several weeks ago, resolved, and has returned over the past few days.  Pain has involved his entire right flank and is constant for days at a time.  Associated chills, sweats, shaking, now with dry heaves.  Has dark urine and occasional burning dysuria.  Has hx kidney stones but states this feels different.  Denies recent travel.  Works outside in [employer] but has no known tick bites.  Denies rash, joint pain, headache, stiff neck.  Denies testicular pain or swelling, penile discharge.  No blood in emesis or diarrhea.  Has not taken any medication for his symptoms.  His significant other had a N/V/D illness last week that only lasted one day.  He also had some suspicious chicken a few days ago from the grocery store, no other abnormal or suspicious foods.     Past Medical History  Diagnosis Date  . Asthma    History reviewed. No pertinent past surgical history. No family history on file. History  Substance Use Topics  . Smoking status: Current Every Day Smoker -- 0.50 packs/day    Types: Cigarettes  . Smokeless tobacco: Not on file  . Alcohol Use: Yes    Review of Systems  All other systems reviewed and are negative.     Allergies  Almond oil; Aspirin; and Peanut-containing drug products  Home Medications   Prior to Admission medications   Medication Sig Start Date End Date Taking? Authorizing Provider  naproxen (NAPROSYN) 500 MG tablet Take 1 tablet (500 mg total) by mouth 2 (two) times daily. Patient not taking: Reported on 01/13/2015 11/01/13   Marlon Pel, PA-C  oxyCODONE-acetaminophen (PERCOCET/ROXICET)  5-325 MG per tablet Take 1-2 tablets by mouth every 6 (six) hours as needed for severe pain. Patient not taking: Reported on 01/13/2015 11/01/13   Marlon Pel, PA-C   BP 131/95 mmHg  Pulse 61  Temp(Src) 98 F (36.7 C)  Resp 20  SpO2 100% Physical Exam  Constitutional: He appears well-developed and well-nourished. No distress.  HENT:  Head: Normocephalic and atraumatic.  Neck: Neck supple.  Cardiovascular: Normal rate and regular rhythm.   Pulmonary/Chest: Effort normal and breath sounds normal. No respiratory distress. He has no wheezes. He has no rales.  Abdominal: Soft. He exhibits no distension and no mass. There is tenderness in the right lower quadrant. There is no rebound and no guarding.  Neurological: He is alert. He exhibits normal muscle tone.  Skin: He is not diaphoretic.  Nursing note and vitals reviewed.   ED Course  Procedures (including critical care time) Labs Review Labs Reviewed  CBC WITH DIFFERENTIAL/PLATELET - Abnormal; Notable for the following:    WBC 10.7 (*)    MCHC 36.5 (*)    Lymphs Abs 4.1 (*)    All other components within normal limits  COMPREHENSIVE METABOLIC PANEL - Abnormal; Notable for the following:    Glucose, Bld 130 (*)    All other components within normal limits  URINALYSIS, ROUTINE W REFLEX MICROSCOPIC (NOT AT [facility]) - Abnormal; Notable for the following:    APPearance CLOUDY (*)    Specific Gravity, Urine 1.046 (*)  Hgb urine dipstick LARGE (*)    Protein, ur 30 (*)    Leukocytes, UA TRACE (*)    All other components within normal limits  URINE MICROSCOPIC-ADD ON - Abnormal; Notable for the following:    Bacteria, UA FEW (*)    All other components within normal limits  URINE CULTURE  LIPASE, BLOOD    Imaging Review Ct Abdomen Pelvis W Contrast  01/13/2015   CLINICAL DATA:  Right-sided abdominal pain for 2 weeks. Nausea and vomiting.  EXAM: CT ABDOMEN AND PELVIS WITH CONTRAST  TECHNIQUE: Multidetector CT imaging of the  abdomen and pelvis was performed using the standard protocol following bolus administration of intravenous contrast.  CONTRAST:  OMNIPAQUE IOHEXOL 300 MG/ML  SOLN  COMPARISON:  None.  FINDINGS: Lower chest: Lung bases are clear.  Hepatobiliary: No focal hepatic lesion. No biliary duct dilatation. Gallbladder is normal. Common bile duct is normal.  Pancreas: Pancreas is normal. No ductal dilatation. No pancreatic inflammation.  Spleen: Normal spleen  Adrenals/urinary tract: Adrenal glands are normal.  There is severe hydronephrosis of the right kidney and hydroureter. There is obstructing calculus within the mid right ureter which measures 12 cm in craniocaudad dimension. This calculus is at the S1 vertebral body level and is identified on the CT topogram. There is no right renal secretion of IV contrast on the delayed renal imaging.  The left kidney is normal.  No bladder calculi.  Stomach/Bowel: Stomach, small bowel, appendix, and cecum are normal. The colon and rectosigmoid colon are normal.  Vascular/Lymphatic: Abdominal aorta is normal caliber. There is no retroperitoneal or periportal lymphadenopathy. No pelvic lymphadenopathy.  Reproductive: Prostate is normal.  Musculoskeletal: No aggressive osseous lesion.  Other: Perinephric stranding on the right  IMPRESSION: 1. Moderate to severe hydronephrosis of the right kidney secondary to a 12 mm obstructing calculus in the mid right ureter. 2. No right renal secretion of IV contrast on the delayed imaging. 3. Right ureteral calculus identified on the CT topogram.   Electronically Signed   By: Genevive Bi M.D.   On: 01/13/2015 08:23     EKG Interpretation None       I spoke with Dr Craige Cotta, urology, regarding this patient.  Dr Craige Cotta has reviewed patient's CT scan. Recommends close follow up in urology office with Dr Sherron Monday tomorrow given patient's pain control in ED now.    MDM   Final diagnoses:  Right ureteral stone  Hydronephrosis with  urinary obstruction due to ureteral calculus    Afebrile, nontoxic patient with right flank pain, found to have 12mm mid ureteral stone with moderate to severe hydronephrosis.  UA does not appear infected.   D/C home with percocet, zofran, follow up with urology tomorrow.  Discussed return precautions.  Discussed result, findings, treatment, and follow up  with patient.  Pt given return precautions.  Pt verbalizes understanding and agrees with plan.         Trixie Dredge, PA-C 01/13/15 1527  Blake Divine, MD 01/14/15 [id number]

## 2015-01-13 NOTE — Discharge Instructions (Signed)
Read the information below.  Use the prescribed medication as directed.  Please discuss all new medications with your pharmacist.  Do not take additional tylenol while taking the prescribed pain medication to avoid overdose.  You may return to the Emergency Department at any time for worsening condition or any new symptoms that concern you.  If you develop high fevers, worsening abdominal pain, uncontrolled vomiting, or are unable to tolerate fluids by mouth, return to the ER for a recheck.   ° ° ° °Kidney Stones °Kidney stones (urolithiasis) are deposits that form inside your kidneys. The intense pain is caused by the stone moving through the urinary tract. When the stone moves, the ureter goes into spasm around the stone. The stone is usually passed in the urine.  °CAUSES  °· A disorder that makes certain neck glands produce too much parathyroid hormone (primary hyperparathyroidism). °· A buildup of uric acid crystals, similar to gout in your joints. °· Narrowing (stricture) of the ureter. °· A kidney obstruction present at birth (congenital obstruction). °· Previous surgery on the kidney or ureters. °· Numerous kidney infections. °SYMPTOMS  °· Feeling sick to your stomach (nauseous). °· Throwing up (vomiting). °· Blood in the urine (hematuria). °· Pain that usually spreads (radiates) to the groin. °· Frequency or urgency of urination. °DIAGNOSIS  °· Taking a history and physical exam. °· Blood or urine tests. °· CT scan. °· Occasionally, an examination of the inside of the urinary bladder (cystoscopy) is performed. °TREATMENT  °· Observation. °· Increasing your fluid intake. °· Extracorporeal shock wave lithotripsy--This is a noninvasive procedure that uses shock waves to break up kidney stones. °· Surgery may be needed if you have severe pain or persistent obstruction. There are various surgical procedures. Most of the procedures are performed with the use of small instruments. Only small incisions are needed to  accommodate these instruments, so recovery time is minimized. °The size, location, and chemical composition are all important variables that will determine the proper choice of action for you. Talk to your health care provider to better understand your situation so that you will minimize the risk of injury to yourself and your kidney.  °HOME CARE INSTRUCTIONS  °· Drink enough water and fluids to keep your urine clear or pale yellow. This will help you to pass the stone or stone fragments. °· Strain all urine through the provided strainer. Keep all particulate matter and stones for your health care provider to see. The stone causing the pain may be as small as a grain of salt. It is very important to use the strainer each and every time you pass your urine. The collection of your stone will allow your health care provider to analyze it and verify that a stone has actually passed. The stone analysis will often identify what you can do to reduce the incidence of recurrences. °· Only take over-the-counter or prescription medicines for pain, discomfort, or fever as directed by your health care provider. °· Make a follow-up appointment with your health care provider as directed. °· Get follow-up X-rays if required. The absence of pain does not always mean that the stone has passed. It may have only stopped moving. If the urine remains completely obstructed, it can cause loss of kidney function or even complete destruction of the kidney. It is your responsibility to make sure X-rays and follow-ups are completed. Ultrasounds of the kidney can show blockages and the status of the kidney. Ultrasounds are not associated with any radiation and   can be performed easily in a matter of minutes. °SEEK MEDICAL CARE IF: °· You experience pain that is progressive and unresponsive to any pain medicine you have been prescribed. °SEEK IMMEDIATE MEDICAL CARE IF:  °· Pain cannot be controlled with the prescribed medicine. °· You have a  fever or shaking chills. °· The severity or intensity of pain increases over 18 hours and is not relieved by pain medicine. °· You develop a new onset of abdominal pain. °· You feel faint or pass out. °· You are unable to urinate. °MAKE SURE YOU:  °· Understand these instructions. °· Will watch your condition. °· Will get help right away if you are not doing well or get worse. °Document Released: 07/13/2005 Document Revised: 03/15/2013 Document Reviewed: 12/14/2012 °[employer]® Patient Information ©2015 [employer]. This information is not intended to replace advice given to you by your health care provider. Make sure you discuss any questions you have with your health care provider. ° °Hydronephrosis °Hydronephrosis is an abnormal enlargement of your kidney. It can affect one or both the kidneys. It results from the backward pressure of urine on the kidneys, when the flow of urine is blocked. Normally, the urine drains from the kidney through the urine tube (ureter), into a sac which holds the urine until urination (bladder). When the urinary flow is blocked, the urine collects above the block. This causes an increase in the pressure inside the kidney, which in turn leads to its enlargement. The block can occur at the point where the kidney joins the ureter. Treatment depends on the cause and location of the block.  °CAUSES  °The causes of this condition include: °· Birth defect of the kidney or ureter. °· Kink at the point where the kidney joins the ureter. °· Stones and blood clots in the kidney or ureter. °· Cancer, injury, or infection of the ureter. °· Scar tissue formation. °· Backflow of urine (reflux). °· Cancer of bladder or prostate gland. °· Abnormality of the nerves or muscles of the kidney or ureter. °· Lower part of the ureter protruding into the bladder (ureterocele). °· Abnormal contractions of the bladder. °· Both the kidneys can be affected during pregnancy. This is because the enlarging uterus  presses on the ureters and blocks the flow of urine. °SYMPTOMS  °The symptoms depend on the location of the block. They also depend on how long the block has been present. You may feel pain on the affected side. Sometimes, you may not have any symptoms. There may be a dull ache or discomfort in the flank. The common symptoms are: °· Flank pain. °· Swelling of the abdomen. °· Pain in the abdomen. °· Nausea and vomiting. °· Fever. °· Pain while passing urine. °· Urgency for urination. °· Frequent or urgent urination. °· Infection of the urinary tract. °DIAGNOSIS  °Your caregiver will examine you after asking about your symptoms. You may be asked to do blood and urine tests. Your caregiver may order a special X-ray, ultrasound, or CT scan. Sometimes a rigid or flexible telescope (cystoscope) is used to view the site of the blockage.  °TREATMENT  °Treatment depends on the site, cause, and duration of the block. The goal of treatment is to remove the blockage. Your caregiver will plan the treatment based on your condition. The different types of treatment are:  °· Putting in a soft plastic tube (ureteral stent) to connect the bladder with the kidney. This will help in draining the urine. °· Putting in a   soft tube (nephrostomy tube). This is placed through skin into the kidney. The trapped urine is drained out through the back. A plastic bag is attached to your skin to hold the urine that has drained out. °· Antibiotics to treat or prevent infection. °· Breaking down of the stone (lithotripsy). °HOME CARE INSTRUCTIONS  °· It may take some time for the hydronephrosis to go away (resolve). Drink fluids as directed by your caregiver , and get a lot of rest. °· If you have a drain in, your caregiver will give you directions about how to care for it. Be sure you understand these directions completely before you go home. °· Take any antibiotics, pain medications, or other prescriptions exactly as prescribed. °· Follow-up with  your caregivers as directed. °SEEK MEDICAL CARE IF:  °· You continue to have flank pain, nausea, or difficulty with urination. °· You have any problem with any type of drainage device. °· Your urine becomes cloudy or bloody. °SEEK IMMEDIATE MEDICAL CARE IF:  °· You have severe flank and/or abdominal pain. °· You develop vomiting and are unable to hold down fluids. °· You develop a fever above 100.5° F (38.1° C), or as per your caregiver. °MAKE SURE YOU:  °· Understand these instructions. °· Will watch your condition. °· Will get help right away if you are not doing well or get worse. °Document Released: 05/10/2007 Document Revised: 10/05/2011 Document Reviewed: 06/26/2010 °[employer]® Patient Information ©2015 [employer]. This information is not intended to replace advice given to you by your health care provider. Make sure you discuss any questions you have with your health care provider. ° °Dietary Guidelines to Help Prevent Kidney Stones °Your risk of kidney stones can be decreased by adjusting the foods you eat. The most important thing you can do is drink enough fluid. You should drink enough fluid to keep your urine clear or pale yellow. The following guidelines provide specific information for the type of kidney stone you have had. °GUIDELINES ACCORDING TO TYPE OF KIDNEY STONE °Calcium Oxalate Kidney Stones °· Reduce the amount of salt you eat. Foods that have a lot of salt cause your body to release excess calcium into your urine. The excess calcium can combine with a substance called oxalate to form kidney stones. °· Reduce the amount of animal protein you eat if the amount you eat is excessive. Animal protein causes your body to release excess calcium into your urine. Ask your dietitian how much protein from animal sources you should be eating. °· Avoid foods that are high in oxalates. If you take vitamins, they should have less than 500 mg of vitamin C. Your body turns vitamin C into oxalates. You do not  need to avoid fruits and vegetables high in vitamin C. °Calcium Phosphate Kidney Stones °· Reduce the amount of salt you eat to help prevent the release of excess calcium into your urine. °· Reduce the amount of animal protein you eat if the amount you eat is excessive. Animal protein causes your body to release excess calcium into your urine. Ask your dietitian how much protein from animal sources you should be eating. °· Get enough calcium from food or take a calcium supplement (ask your dietitian for recommendations). Food sources of calcium that do not increase your risk of kidney stones include: °¨ Broccoli. °¨ Dairy products, such as cheese and yogurt. °¨ Pudding. °Uric Acid Kidney Stones °· Do not have more than 6 oz of animal protein per day. °FOOD SOURCES °Animal   Protein Sources °· Meat (all types). °· Poultry. °· Eggs. °· Fish, seafood. °Foods High in Salt °· Salt seasonings. °· Soy sauce. °· Teriyaki sauce. °· Cured and processed meats. °· Salted crackers and snack foods. °· Fast food. °· Canned soups and most canned foods. °Foods High in Oxalates °· Grains: °¨ Amaranth. °¨ Barley. °¨ Grits. °¨ Wheat germ. °¨ Bran. °¨ Buckwheat flour. °¨ All bran cereals. °¨ Pretzels. °¨ Whole wheat bread. °· Vegetables: °¨ Beans (wax). °¨ Beets and beet greens. °¨ Collard greens. °¨ Eggplant. °¨ Escarole. °¨ Leeks. °¨ Okra. °¨ Parsley. °¨ Rutabagas. °¨ Spinach. °¨ Swiss chard. °¨ Tomato paste. °¨ Fried potatoes. °¨ Sweet potatoes. °· Fruits: °¨ Red currants. °¨ Figs. °¨ Kiwi. °¨ Rhubarb. °· Meat and Other Protein Sources: °¨ Beans (dried). °¨ Soy burgers and other soybean products. °¨ Miso. °¨ Nuts (peanuts, almonds, pecans, cashews, hazelnuts). °¨ Nut butters. °¨ Sesame seeds and tahini (paste made of sesame seeds). °¨ Poppy seeds. °· Beverages: °¨ Chocolate drink mixes. °¨ Soy milk. °¨ Instant iced tea. °¨ Juices made from high-oxalate fruits or  vegetables. °· Other: °¨ Carob. °¨ Chocolate. °¨ Fruitcake. °¨ Marmalades. °Document Released: 11/07/2010 Document Revised: 07/18/2013 Document Reviewed: 06/09/2013 °[employer]® Patient Information ©2015 [employer]. This information is not intended to replace advice given to you by your health care provider. Make sure you discuss any questions you have with your health care provider. ° °
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
## 2015-01-15 LAB — URINE CULTURE: Culture: NO GROWTH

## 2015-08-12 ENCOUNTER — Emergency Department (HOSPITAL_BASED_OUTPATIENT_CLINIC_OR_DEPARTMENT_OTHER)
Admission: EM | Admit: 2015-08-12 | Discharge: 2015-08-12 | Disposition: A | Discharge status: Home / Self Care | Payer: No Typology Code available for payment source | Attending: Emergency Medicine | Admitting: Emergency Medicine

## 2015-08-12 ENCOUNTER — Encounter (HOSPITAL_BASED_OUTPATIENT_CLINIC_OR_DEPARTMENT_OTHER): Payer: Self-pay | Admitting: *Deleted

## 2015-08-12 DIAGNOSIS — Y9389 Activity, other specified: Secondary | ICD-10-CM | POA: Diagnosis not present

## 2015-08-12 DIAGNOSIS — M542 Cervicalgia: Secondary | ICD-10-CM

## 2015-08-12 DIAGNOSIS — M62838 Other muscle spasm: Secondary | ICD-10-CM | POA: Diagnosis not present

## 2015-08-12 DIAGNOSIS — Y998 Other external cause status: Secondary | ICD-10-CM | POA: Insufficient documentation

## 2015-08-12 DIAGNOSIS — Y92410 Unspecified street and highway as the place of occurrence of the external cause: Secondary | ICD-10-CM | POA: Diagnosis not present

## 2015-08-12 DIAGNOSIS — S0990XA Unspecified injury of head, initial encounter: Secondary | ICD-10-CM | POA: Insufficient documentation

## 2015-08-12 DIAGNOSIS — R519 Headache, unspecified: Secondary | ICD-10-CM

## 2015-08-12 DIAGNOSIS — R51 Headache: Secondary | ICD-10-CM

## 2015-08-12 DIAGNOSIS — S199XXA Unspecified injury of neck, initial encounter: Secondary | ICD-10-CM | POA: Insufficient documentation

## 2015-08-12 DIAGNOSIS — F17210 Nicotine dependence, cigarettes, uncomplicated: Secondary | ICD-10-CM | POA: Diagnosis not present

## 2015-08-12 DIAGNOSIS — J45909 Unspecified asthma, uncomplicated: Secondary | ICD-10-CM | POA: Diagnosis not present

## 2015-08-12 MED ORDER — IBUPROFEN 800 MG PO TABS
800.0000 mg | ORAL_TABLET | Freq: Once | ORAL | Status: AC
  Administered 2015-08-12: 800 mg via ORAL
  Filled 2015-08-12: qty 1

## 2015-08-12 MED ORDER — CYCLOBENZAPRINE HCL 10 MG PO TABS: 10.0000 mg | ORAL_TABLET | Freq: Three times a day (TID) | ORAL | Status: DC | PRN

## 2015-08-12 MED ORDER — IBUPROFEN 800 MG PO TABS: 800.0000 mg | ORAL_TABLET | Freq: Three times a day (TID) | ORAL | Status: DC | PRN
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
## 2015-08-12 NOTE — ED Notes (Signed)
Pt sleeping on bed.
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
## 2015-08-12 NOTE — ED Notes (Signed)
Pt verbalizes understanding of d/c instructions and denies any further needs at this time. 

## 2016-08-30 IMAGING — CT CT ABD-PELV W/ CM
2 of 5 series · 16 of 46 positions shown, 18 images · IV contrast (Omni 300)
Comparison: None.

CLINICAL DATA: Right-sided abdominal pain for 2 weeks. Nausea and
vomiting.

EXAM:
CT ABDOMEN AND PELVIS WITH CONTRAST
TECHNIQUE: Multidetector CT imaging of the abdomen and pelvis was performed
using the standard protocol following bolus administration of
intravenous contrast.
CONTRAST:  100mL OMNIPAQUE IOHEXOL 300 MG/ML  SOLN

[Series 2: abd/ pelvis 5.0 i30f 1 · axial · 0.74mm/px · z∈[+845,+1280]mm · 13 of 99 slices shown, 15 images]
[im 6/99  soft-tissue]
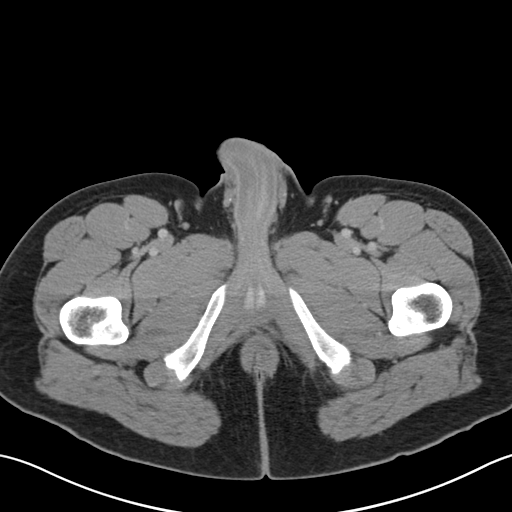
[im 6/99  bone]
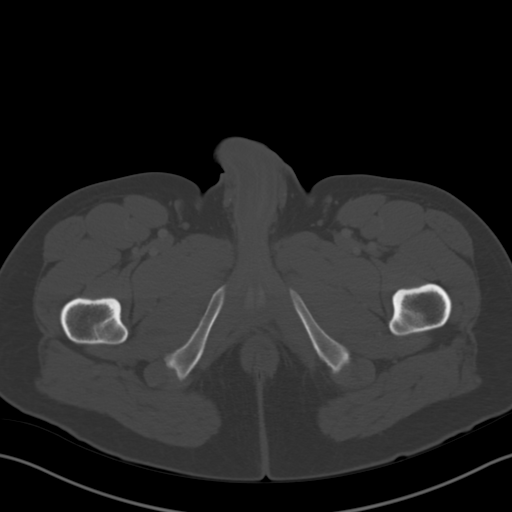
[im 16/99  soft-tissue]
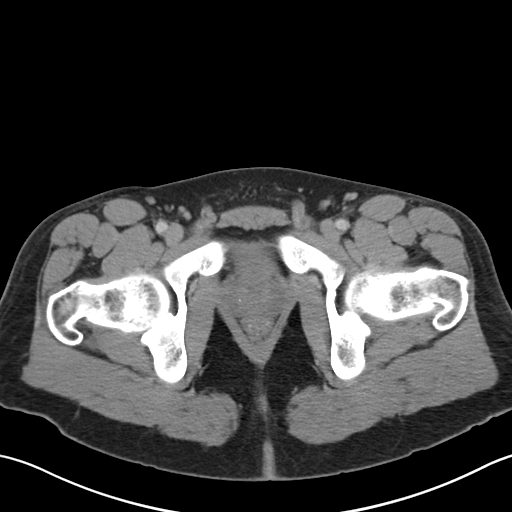
[im 21/99  soft-tissue]
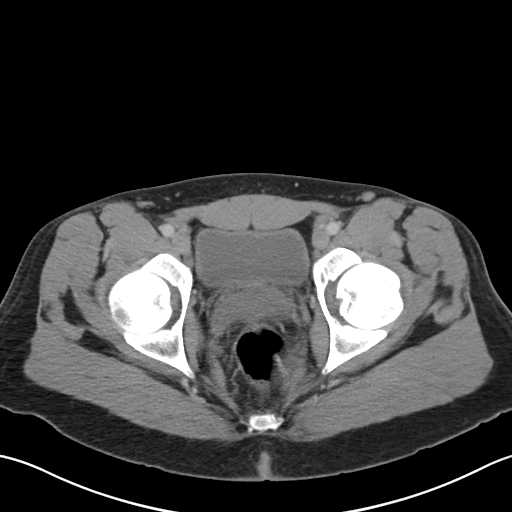
[im 26/99  soft-tissue]
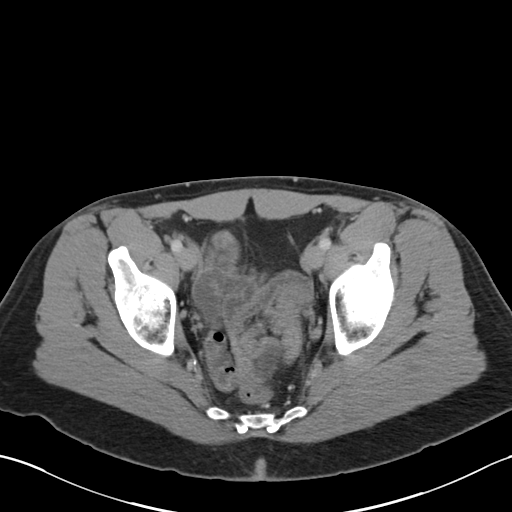
[im 37/99  soft-tissue]
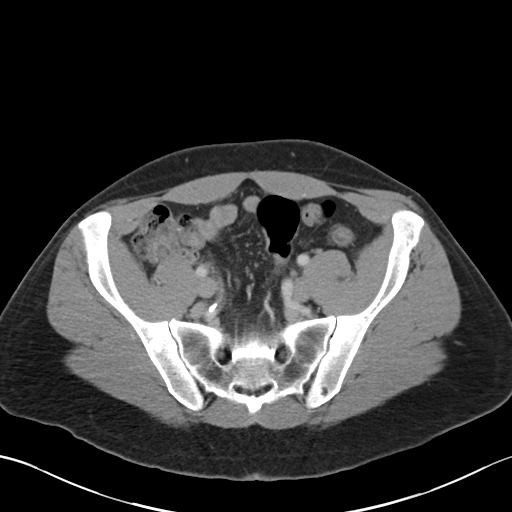
[im 42/99  soft-tissue]
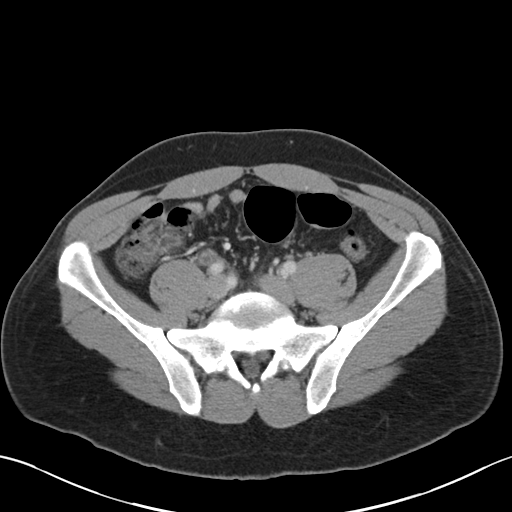
[im 52/99  soft-tissue]
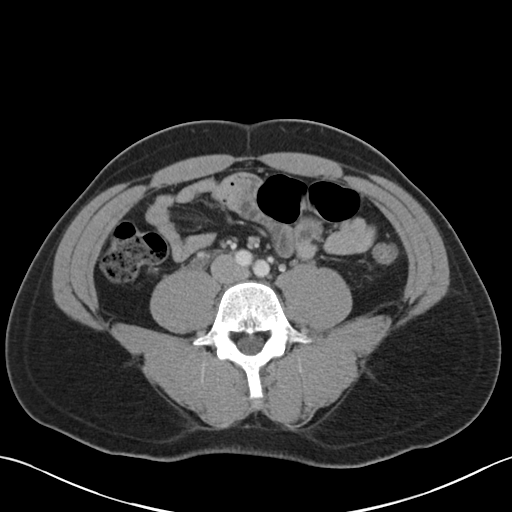
[im 57/99  soft-tissue]
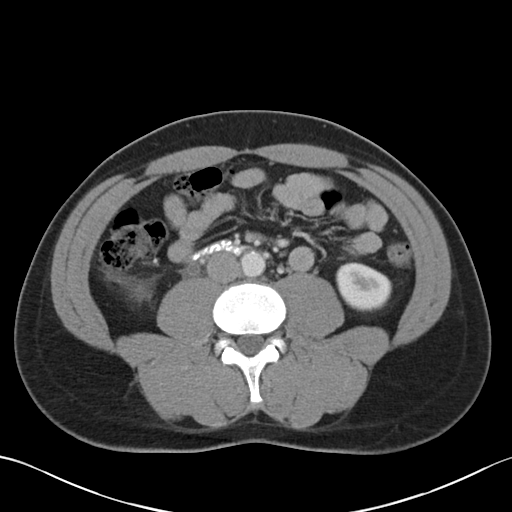
[im 62/99  soft-tissue]
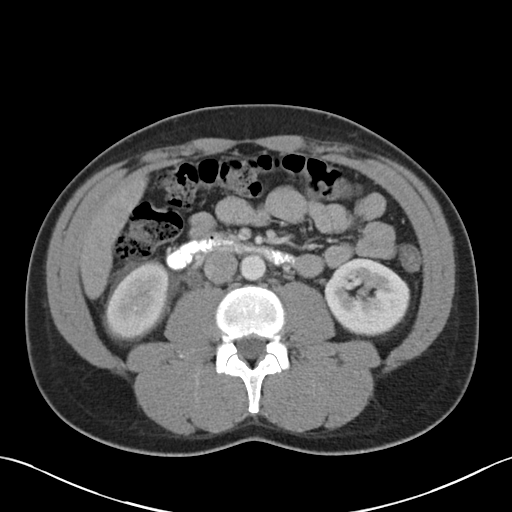
[im 62/99  bone]
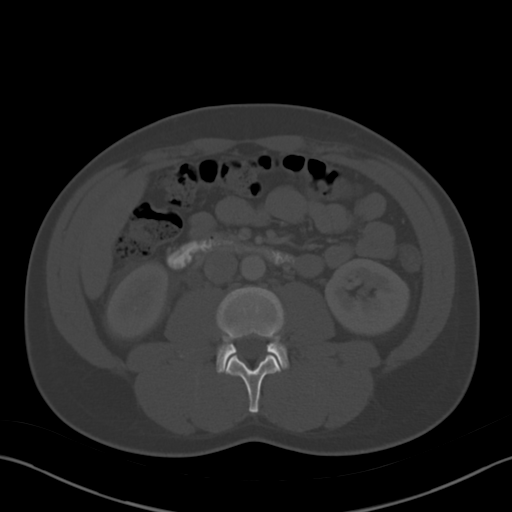
[im 73/99  soft-tissue]
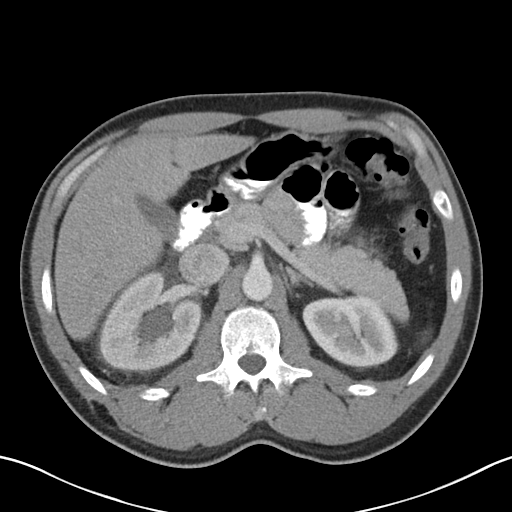
[im 78/99  soft-tissue]
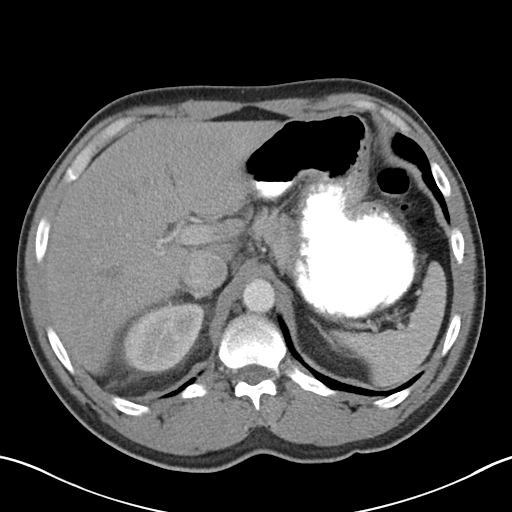
[im 83/99  soft-tissue]
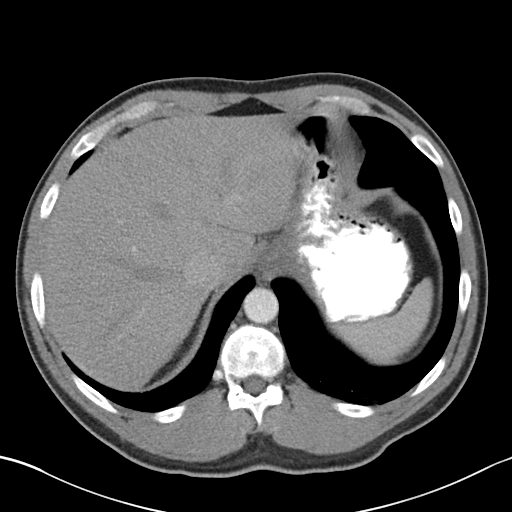
[im 93/99  soft-tissue]
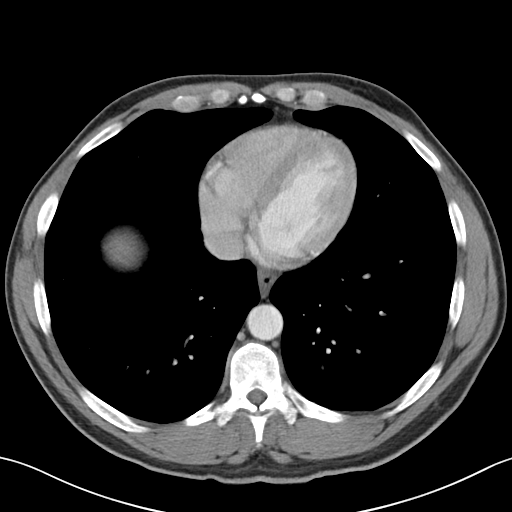

[Series 5: coronals · coronal · 0.70mm/px · 3 of 142 slices shown]
[im 48/142  soft-tissue]
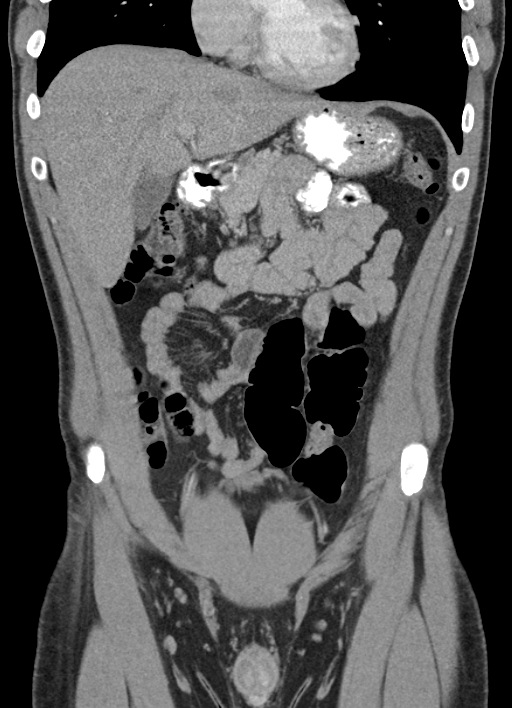
[im 63/142  soft-tissue]
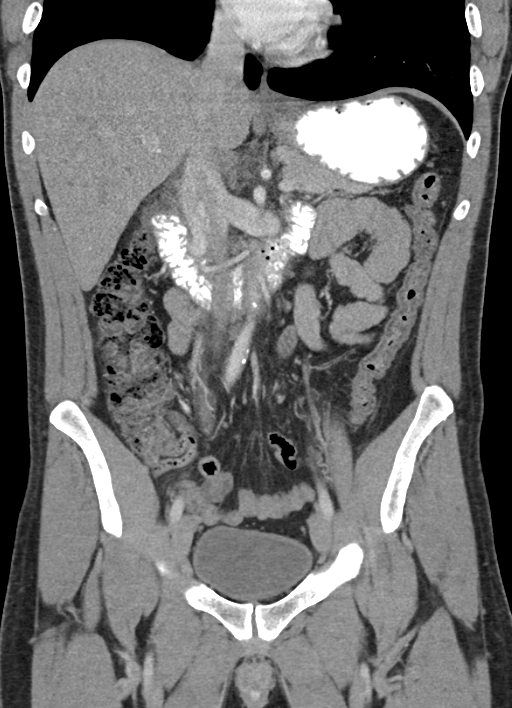
[im 79/142  soft-tissue]
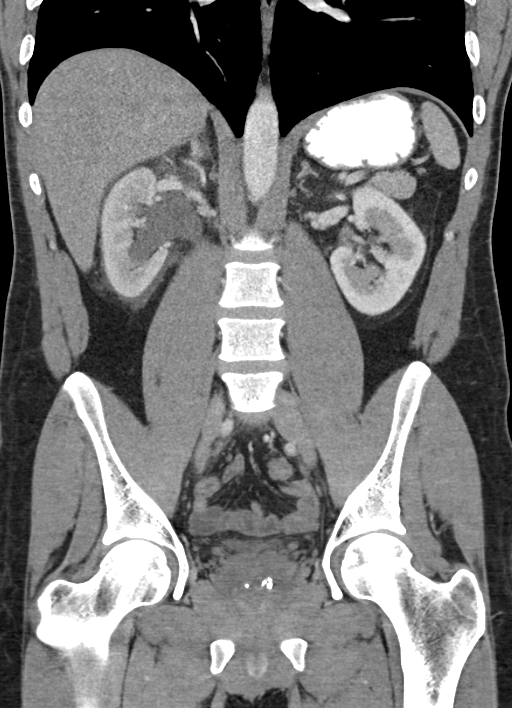

[16 of 46 positions shown; findings below may reference images not displayed]

FINDINGS: Lower chest: Lung bases are clear.

Hepatobiliary: No focal hepatic lesion. No biliary duct dilatation.
Gallbladder is normal. Common bile duct is normal.

Pancreas: Pancreas is normal. No ductal dilatation. No pancreatic
inflammation.

Spleen: Normal spleen

Adrenals/urinary tract: Adrenal glands are normal.

There is severe hydronephrosis of the right kidney and hydroureter.
There is obstructing calculus within the mid right ureter which
measures 12 cm in craniocaudad dimension. This calculus is at the S1
vertebral body level and is identified on the CT topogram. There is
no right renal secretion of IV contrast on the delayed renal
imaging.

The left kidney is normal.  No bladder calculi.

Stomach/Bowel: Stomach, small bowel, appendix, and cecum are normal.
The colon and rectosigmoid colon are normal.

Vascular/Lymphatic: Abdominal aorta is normal caliber. There is no
retroperitoneal or periportal lymphadenopathy. No pelvic
lymphadenopathy.

Reproductive: Prostate is normal.

Musculoskeletal: No aggressive osseous lesion.

Other: Perinephric stranding on the right
IMPRESSION: 1. Moderate to severe hydronephrosis of the right kidney secondary
to a 12 mm obstructing calculus in the mid right ureter.
2. No right renal secretion of IV contrast on the delayed imaging.
3. Right ureteral calculus identified on the CT topogram.

## 2018-06-24 ENCOUNTER — Encounter (HOSPITAL_COMMUNITY): Payer: Self-pay

## 2018-06-24 ENCOUNTER — Ambulatory Visit (HOSPITAL_COMMUNITY)
Admission: EM | Admit: 2018-06-24 | Discharge: 2018-06-24 | Disposition: A | Discharge status: Home / Self Care | Payer: Self-pay | Attending: Family Medicine | Admitting: Family Medicine

## 2018-06-24 DIAGNOSIS — K047 Periapical abscess without sinus: Secondary | ICD-10-CM

## 2018-06-24 MED ORDER — NAPROXEN 500 MG PO TABS: 500.0000 mg | ORAL_TABLET | Freq: Two times a day (BID) | ORAL | 0 refills | Status: DC

## 2018-06-24 MED ORDER — PENICILLIN V POTASSIUM 500 MG PO TABS: 500.0000 mg | ORAL_TABLET | Freq: Four times a day (QID) | ORAL | 0 refills | Status: AC

## 2018-06-24 NOTE — ED Triage Notes (Signed)
Pt present left side bottom of his tooth is sore and swelling.

## 2018-06-24 NOTE — Discharge Instructions (Addendum)
We will go ahead and treat you for dental infection today He will take the penicillin 4 times a day for 10 days Naproxen 5 mg twice a day as needed with food Dental resources attached to discharge papers

## 2018-06-26 NOTE — ED Provider Notes (Signed)
[facility]    CSN: [id number] Arrival date & time: 06/24/18  1208     History   Chief Complaint Chief Complaint  Patient presents with  . Abscess    left side of patient mouth.    HPI Richard Nielsen is a 42 y.o. male.    Dental Pain  Location:  Lower Quality:  Aching Severity:  Mild Onset quality:  Gradual Duration:  2 days Timing:  Constant Progression:  Worsening Chronicity:  New Context: abscess and dental caries   Relieved by:  Nothing Worsened by:  Touching, pressure, hot food/drink and cold food/drink Ineffective treatments:  NSAIDs Associated symptoms: facial swelling and gum swelling   Associated symptoms: no congestion, no difficulty swallowing, no drooling, no facial pain, no fever, no headaches, no neck pain, no neck swelling, no oral bleeding, no oral lesions and no trismus     Past Medical History:  Diagnosis Date  . Asthma     There are no active problems to display for this patient.   History reviewed. No pertinent surgical history.     Home Medications    Prior to Admission medications   Medication Sig Start Date End Date Taking? Authorizing Provider  cyclobenzaprine (FLEXERIL) 10 MG tablet Take 1 tablet (10 mg total) by mouth 3 (three) times daily as needed for muscle spasms (and pain). 08/12/15   Trixie DredgeWest, Emily, PA-C  ibuprofen (ADVIL,MOTRIN) 800 MG tablet Take 1 tablet (800 mg total) by mouth every 8 (eight) hours as needed. 08/12/15   Trixie DredgeWest, Emily, PA-C  naproxen (NAPROSYN) 500 MG tablet Take 1 tablet (500 mg total) by mouth 2 (two) times daily. 06/24/18   Dahlia ByesBast, Charlyn Vialpando A, NP  ondansetron (ZOFRAN) 4 MG tablet Take 1 tablet (4 mg total) by mouth every 8 (eight) hours as needed for nausea or vomiting. 01/13/15   Trixie DredgeWest, Emily, PA-C  oxyCODONE-acetaminophen (PERCOCET/ROXICET) 5-325 MG per tablet Take 2 tablets by mouth every 4 (four) hours as needed for severe pain. 01/13/15   Trixie DredgeWest, Emily, PA-C  penicillin v potassium (VEETID) 500 MG  tablet Take 1 tablet (500 mg total) by mouth 4 (four) times daily for 10 days. 06/24/18 07/04/18  Janace ArisBast, Udell Mazzocco A, NP    Family History History reviewed. No pertinent family history.  Social History Social History   Tobacco Use  . Smoking status: Current Every Day Smoker    Packs/day: 0.50    Types: Cigarettes  . Smokeless tobacco: Current User  Substance Use Topics  . Alcohol use: Yes  . Drug use: No     Allergies   Almond oil; Aspirin; and Peanut-containing drug products   Review of Systems Review of Systems  Constitutional: Negative for fever.  HENT: Positive for facial swelling. Negative for congestion, drooling and mouth sores.   Musculoskeletal: Negative for neck pain.  Neurological: Negative for headaches.     Physical Exam Triage Vital Signs ED Triage Vitals  Enc Vitals Group     BP 06/24/18 1430 114/69     Pulse Rate 06/24/18 1430 66     Resp 06/24/18 1430 18     Temp 06/24/18 1430 98.1 F (36.7 C)     Temp Source 06/24/18 1430 Oral     SpO2 06/24/18 1430 99 %     Weight --      Height --      Head Circumference --      Peak Flow --      Pain Score 06/24/18 1432 9  Pain Loc --      Pain Edu? --      Excl. in GC? --    No data found.  Updated Vital Signs BP 114/69 (BP Location: Left Arm)   Pulse 66   Temp 98.1 F (36.7 C) (Oral)   Resp 18   SpO2 99%   Visual Acuity Right Eye Distance:   Left Eye Distance:   Bilateral Distance:    Right Eye Near:   Left Eye Near:    Bilateral Near:     Physical Exam  Constitutional: He appears well-developed and well-nourished.  HENT:  Head: Normocephalic and atraumatic.  Left lower gingival swelling with erythema. multiple caries in the mouth. Left lower facial swelling. No trismus or neck swelling.   Eyes: Conjunctivae are normal.  Neck: Normal range of motion.  Pulmonary/Chest: Effort normal.  Musculoskeletal: Normal range of motion.  Lymphadenopathy:    He has no cervical adenopathy.    Neurological: He is alert.  Skin: Skin is warm and dry.  Psychiatric: He has a normal mood and affect.  Nursing note and vitals reviewed.    UC Treatments / Results  Labs (all labs ordered are listed, but only abnormal results are displayed) Labs Reviewed - No data to display  EKG None  Radiology No results found.  Procedures Procedures (including critical care time)  Medications Ordered in UC Medications - No data to display  Initial Impression / Assessment and Plan / UC Course  I have reviewed the triage vital signs and the nursing notes.  Pertinent labs & imaging results that were available during my care of the patient were reviewed by me and considered in my medical decision making (see chart for details).     Treating for dental infection with PCN.  Naproxen for pain and inflammation Dental resources given.  Final Clinical Impressions(s) / UC Diagnoses   Final diagnoses:  Dental abscess     Discharge Instructions     We will go ahead and treat you for dental infection today He will take the penicillin 4 times a day for 10 days Naproxen 5 mg twice a day as needed with food Dental resources attached to discharge papers    ED Prescriptions    Medication Sig Dispense Auth. Provider   naproxen (NAPROSYN) 500 MG tablet Take 1 tablet (500 mg total) by mouth 2 (two) times daily. 30 tablet Shadiamond Koska A, NP   penicillin v potassium (VEETID) 500 MG tablet Take 1 tablet (500 mg total) by mouth 4 (four) times daily for 10 days. 40 tablet Janace Aris, NP     Controlled Substance Prescriptions [place] Controlled Substance Registry consulted? no   Janace Aris, NP 06/26/18 [phone]

## 2021-06-12 ENCOUNTER — Other Ambulatory Visit: Payer: Self-pay

## 2021-06-12 ENCOUNTER — Encounter (HOSPITAL_COMMUNITY): Payer: Self-pay | Admitting: Emergency Medicine

## 2021-06-12 ENCOUNTER — Emergency Department (HOSPITAL_COMMUNITY)
Admission: EM | Admit: 2021-06-12 | Discharge: 2021-06-13 | Disposition: A | Discharge status: Home / Self Care | Payer: Self-pay | Attending: Emergency Medicine | Admitting: Emergency Medicine

## 2021-06-12 ENCOUNTER — Emergency Department (HOSPITAL_COMMUNITY): Payer: Self-pay

## 2021-06-12 DIAGNOSIS — R0981 Nasal congestion: Secondary | ICD-10-CM | POA: Insufficient documentation

## 2021-06-12 DIAGNOSIS — J45909 Unspecified asthma, uncomplicated: Secondary | ICD-10-CM | POA: Insufficient documentation

## 2021-06-12 DIAGNOSIS — R0602 Shortness of breath: Secondary | ICD-10-CM | POA: Insufficient documentation

## 2021-06-12 DIAGNOSIS — Z5321 Procedure and treatment not carried out due to patient leaving prior to being seen by health care provider: Secondary | ICD-10-CM | POA: Insufficient documentation

## 2021-06-12 DIAGNOSIS — R059 Cough, unspecified: Secondary | ICD-10-CM | POA: Insufficient documentation

## 2021-06-12 LAB — TROPONIN I (HIGH SENSITIVITY)
Troponin I (High Sensitivity): 5 ng/L (ref <18)
Troponin I (High Sensitivity): 4 ng/L (ref <18)

## 2021-06-12 LAB — CBC
HCT: 47.9 % (ref 39.0–52.0)
Hemoglobin: 16 g/dL (ref 13.0–17.0)
MCH: 31.1 pg (ref 26.0–34.0)
MCHC: 33.4 g/dL (ref 30.0–36.0)
MCV: 93 fL (ref 80.0–100.0)
Platelets: 174 10*3/uL (ref 150–400)
RBC: 5.15 MIL/uL (ref 4.22–5.81)
RDW: 14.5 % (ref 11.5–15.5)
WBC: 5.6 10*3/uL (ref 4.0–10.5)
nRBC: 0 % (ref 0.0–0.2)

## 2021-06-12 LAB — BASIC METABOLIC PANEL
Anion gap: 11 (ref 5–15)
BUN: 9 mg/dL (ref 6–20)
CO2: 24 mmol/L (ref 22–32)
Calcium: 8.9 mg/dL (ref 8.9–10.3)
Chloride: 103 mmol/L (ref 98–111)
Creatinine, Ser: 1.04 mg/dL (ref 0.61–1.24)
GFR, Estimated: >60 mL/min (ref >60)
Glucose, Bld: 144 mg/dL — ABNORMAL HIGH (ref 70–99)
Potassium: 3.9 mmol/L (ref 3.5–5.1)
Sodium: 138 mmol/L (ref 135–145)

## 2021-06-12 NOTE — ED Provider Notes (Signed)
Emergency Medicine Provider Triage Evaluation Note  Richard Nielsen , a 45 y.o. male  was evaluated in triage.  Pt complains of chest tightness, sob, cough.  Review of Systems  Positive: Chest tightness, sob, cough Negative: Leg swelling  Physical Exam  BP (!) 144/82 (BP Location: Right Arm)   Pulse (!) 56   Temp 98.5 F (36.9 C) (Oral)   Resp 18   Ht 6\' 5"  (1.956 m)   Wt 104.3 kg   SpO2 97%   BMI 27.27 kg/m  Gen:   Awake, no distress   Resp:  Normal effort  MSK:   Moves extremities without difficulty  Other:  Lungs ctab, heart rrr  Medical Decision Making  Medically screening exam initiated at 3:53 PM.  Appropriate orders placed.  was informed that the remainder of the evaluation will be completed by another provider, this initial triage assessment does not replace that evaluation, and the importance of remaining in the ED until their evaluation is complete.     Deneen Harts, PA-C 06/12/21 1554    06/14/21, MD 06/13/21 [phone]

## 2021-06-12 NOTE — ED Triage Notes (Addendum)
Pt presents to ED Pov. Pt c/o Sob w/ exertion since last sat. Pt reports that he also has been cough and congested since Monday. Resp e/u in triage. Hx asthma.

## 2021-06-13 NOTE — ED Notes (Signed)
Pt left due to wait time  

## 2022-08-03 ENCOUNTER — Encounter (HOSPITAL_COMMUNITY): Payer: Self-pay | Admitting: *Deleted

## 2022-08-03 ENCOUNTER — Other Ambulatory Visit: Payer: Self-pay

## 2022-08-03 ENCOUNTER — Ambulatory Visit (HOSPITAL_COMMUNITY)
Admission: EM | Admit: 2022-08-03 | Discharge: 2022-08-03 | Disposition: A | Discharge status: Home / Self Care | Payer: Commercial Managed Care - HMO | Attending: Emergency Medicine | Admitting: Emergency Medicine

## 2022-08-03 DIAGNOSIS — B349 Viral infection, unspecified: Secondary | ICD-10-CM | POA: Diagnosis not present

## 2022-08-03 DIAGNOSIS — J45901 Unspecified asthma with (acute) exacerbation: Secondary | ICD-10-CM | POA: Diagnosis not present

## 2022-08-03 MED ORDER — ALBUTEROL SULFATE (2.5 MG/3ML) 0.083% IN NEBU
INHALATION_SOLUTION | RESPIRATORY_TRACT | Status: AC
  Filled 2022-08-03: qty 3

## 2022-08-03 MED ORDER — METHYLPREDNISOLONE SODIUM SUCC 125 MG IJ SOLR
80.0000 mg | Freq: Once | INTRAMUSCULAR | Status: AC
  Administered 2022-08-03: 80 mg via INTRAMUSCULAR

## 2022-08-03 MED ORDER — PREDNISONE 10 MG PO TABS: ORAL_TABLET | ORAL | 0 refills | Status: AC

## 2022-08-03 MED ORDER — ALBUTEROL SULFATE (2.5 MG/3ML) 0.083% IN NEBU: 2.5000 mg | INHALATION_SOLUTION | Freq: Four times a day (QID) | RESPIRATORY_TRACT | 0 refills | Status: AC | PRN

## 2022-08-03 MED ORDER — ALBUTEROL SULFATE (2.5 MG/3ML) 0.083% IN NEBU
2.5000 mg | INHALATION_SOLUTION | Freq: Once | RESPIRATORY_TRACT | Status: AC
  Administered 2022-08-03: 2.5 mg via RESPIRATORY_TRACT

## 2022-08-03 MED ORDER — METHYLPREDNISOLONE SODIUM SUCC 125 MG IJ SOLR
INTRAMUSCULAR | Status: AC
  Filled 2022-08-03: qty 2

## 2022-08-03 MED ORDER — ALBUTEROL SULFATE HFA 108 (90 BASE) MCG/ACT IN AERS: 2.0000 | INHALATION_SPRAY | Freq: Four times a day (QID) | RESPIRATORY_TRACT | 0 refills | Status: AC | PRN

## 2022-08-03 MED ORDER — IPRATROPIUM-ALBUTEROL 0.5-2.5 (3) MG/3ML IN SOLN
3.0000 mL | Freq: Once | RESPIRATORY_TRACT | Status: AC
  Administered 2022-08-03: 3 mL via RESPIRATORY_TRACT

## 2022-08-03 MED ORDER — IPRATROPIUM-ALBUTEROL 0.5-2.5 (3) MG/3ML IN SOLN
RESPIRATORY_TRACT | Status: AC
  Filled 2022-08-03: qty 3

## 2022-08-03 NOTE — ED Provider Notes (Signed)
[facility]    CSN: [id number] Arrival date & time: 08/03/22  1032      History   Chief Complaint Chief Complaint  Patient presents with   asthma    HPI Richard Nielsen is a 47 y.o. male.  Patient presents complaining of nonproductive cough, chest congestion, and shortness of breath that started 3 days ago.  Patient reports that the shortness of breath has progressively worsened especially at night.  He reports that a cold may have triggered his asthma flare, he has had intermittent chills, fatigue, and subjective fever.  He denies any known exposure to a viral illness.  He reports that he has used his siblings albuterol inhaler with minimal relief of symptoms.  He reports intermittent wheezing.  He states that he has not had an asthma flareup in "years".   HPI  Past Medical History:  Diagnosis Date   Asthma     There are no problems to display for this patient.   History reviewed. No pertinent surgical history.     Home Medications    Prior to Admission medications   Medication Sig Start Date End Date Taking? Authorizing Provider  albuterol (PROVENTIL) (2.5 MG/3ML) 0.083% nebulizer solution Take 3 mLs (2.5 mg total) by nebulization every 6 (six) hours as needed for wheezing or shortness of breath. 08/03/22  Yes Debby Freiberg, NP  albuterol (VENTOLIN HFA) 108 (90 Base) MCG/ACT inhaler Inhale 2 puffs into the lungs every 6 (six) hours as needed for wheezing or shortness of breath. 08/03/22  Yes Debby Freiberg, NP  predniSONE (DELTASONE) 10 MG tablet Take 6 tablets on the first day, 5 tablets on the second day, 4 tablets on the third day, 3 tablets on the fourth day, 2 tablets on the fifth day, and 1 tablet on the last day. 08/03/22  Yes Debby Freiberg, NP    Family History History reviewed. No pertinent family history.  Social History Social History   Tobacco Use   Smoking status: Every Day    Packs/day: 0.50    Types: Cigarettes   Smokeless  tobacco: Current  Substance Use Topics   Alcohol use: Yes   Drug use: No     Allergies   Almond oil, Aspirin, and Peanut-containing drug products   Review of Systems Review of Systems  Constitutional:  Positive for activity change, chills, fatigue and fever. Negative for appetite change.  HENT: Negative.    Eyes: Negative.   Respiratory:  Positive for cough, chest tightness, shortness of breath and wheezing. Negative for apnea and stridor.   Cardiovascular:  Negative for chest pain and palpitations.  Gastrointestinal: Negative.      Physical Exam Triage Vital Signs ED Triage Vitals [08/03/22 1326]  Enc Vitals Group     BP 135/89     Pulse Rate 76     Resp 20     Temp 99 F (37.2 C)     Temp src      SpO2 93 %     Weight      Height      Head Circumference      Peak Flow      Pain Score      Pain Loc      Pain Edu?      Excl. in GC?    No data found.  Updated Vital Signs BP 135/89   Pulse 76   Temp 99 F (37.2 C)   Resp 20   SpO2 93%  Physical Exam Vitals and nursing note reviewed.  Constitutional:      Appearance: Normal appearance.  Cardiovascular:     Rate and Rhythm: Normal rate and regular rhythm.     Heart sounds: Normal heart sounds, S1 normal and S2 normal.  Pulmonary:     Effort: Tachypnea present.     Breath sounds: Examination of the right-upper field reveals wheezing. Examination of the left-upper field reveals wheezing. Examination of the right-middle field reveals wheezing. Examination of the left-middle field reveals wheezing. Examination of the right-lower field reveals wheezing. Examination of the left-lower field reveals wheezing. Wheezing (Expiratory wheezing noted) present. No decreased breath sounds, rhonchi or rales.  Neurological:     Mental Status: He is alert.      UC Treatments / Results  Labs (all labs ordered are listed, but only abnormal results are displayed) Labs Reviewed - No data to  display  EKG   Radiology No results found.  Procedures Procedures (including critical care time)  Medications Ordered in UC Medications  albuterol (PROVENTIL) (2.5 MG/3ML) 0.083% nebulizer solution 2.5 mg (2.5 mg Nebulization Given 08/03/22 1429)  methylPREDNISolone sodium succinate (SOLU-MEDROL) 125 mg/2 mL injection 80 mg (80 mg Intramuscular Given 08/03/22 1429)  ipratropium-albuterol (DUONEB) 0.5-2.5 (3) MG/3ML nebulizer solution 3 mL (3 mLs Nebulization Given 08/03/22 1513)    Initial Impression / Assessment and Plan / UC Course  I have reviewed the triage vital signs and the nursing notes.  Pertinent labs & imaging results that were available during my care of the patient were reviewed by me and considered in my medical decision making (see chart for details).     Patient was evaluated for asthma exacerbation and viral illness.  Etiology of symptoms seems to be related to an initial viral illness which is triggered the asthma flareup.  Patient declined COVID and declined influenza testing.  2 Breathing treatments and steroid injection was given in office. Patient experienced some relief of symptoms, oxygen saturation improved to 95% (this Clinical research associate collected) after medication administration.  At home nebulizer given to patient. Albuterol inhaler, nebulizer medication,  and prednisone taper was sent to the pharmacy.  Patient was made aware of symptom management of a viral illness.  Patient was made aware of timeline for symptom resolution and when follow-up would be necessary.  Work note was given.  Patient verbalized understanding of instructions.  Charting was provided using a a verbal dictation system, charting was proofread for errors, errors may occur which could change the meaning of the information charted.   Final Clinical Impressions(s) / UC Diagnoses   Final diagnoses:  Asthma with acute exacerbation, unspecified asthma severity, unspecified whether persistent  Viral illness      Discharge Instructions      A prednisone taper has been sent to the pharmacy, you can start taking this tomorrow morning.  Nebulizer medication can be used every 6 hours as needed.  Albuterol inhaler refill has been sent to the pharmacy.  Viral illnesses usually takes 7 to 10 days to resolve.  Using over-the-counter medications can help treat the symptoms.  You may rotate Tylenol and ibuprofen every 4-6 hours.  For example, take Tylenol now and then 4 to 6 hours later take ibuprofen.  You can use Tylenol for fever and moderate pain, you can take this medication every 4-6 hours, please do not take more than 3000 mg in a 24-hour day.  You can take ibuprofen every 6 hours, do not take more than 2400 mg in a  24-hour day.  I advised that you do not take ibuprofen on an empty stomach, ibuprofen can cause GI problems such as GI bleeding.  Maintaining hydration status is very important, please drink at least 8 cups of water daily.  Please try to intake nutrient dense meals.       ED Prescriptions     Medication Sig Dispense Auth. Provider   albuterol (VENTOLIN HFA) 108 (90 Base) MCG/ACT inhaler Inhale 2 puffs into the lungs every 6 (six) hours as needed for wheezing or shortness of breath. 8 g Flossie Dibble, NP   predniSONE (DELTASONE) 10 MG tablet Take 6 tablets on the first day, 5 tablets on the second day, 4 tablets on the third day, 3 tablets on the fourth day, 2 tablets on the fifth day, and 1 tablet on the last day. 21 tablet Flossie Dibble, NP   albuterol (PROVENTIL) (2.5 MG/3ML) 0.083% nebulizer solution Take 3 mLs (2.5 mg total) by nebulization every 6 (six) hours as needed for wheezing or shortness of breath. 75 mL Flossie Dibble, NP      PDMP not reviewed this encounter.   Flossie Dibble, NP 08/03/22 [phone]

## 2022-08-03 NOTE — ED Triage Notes (Signed)
Pt reports his asthma is brother him . Pt is using an inhaler given to him by his sister. Pt has a cough and congestion also.

## 2022-08-03 NOTE — Discharge Instructions (Addendum)
A prednisone taper has been sent to the pharmacy, you can start taking this tomorrow morning.  Nebulizer medication can be used every 6 hours as needed.  Albuterol inhaler refill has been sent to the pharmacy.  Viral illnesses usually takes 7 to 10 days to resolve.  Using over-the-counter medications can help treat the symptoms.  You may rotate Tylenol and ibuprofen every 4-6 hours.  For example, take Tylenol now and then 4 to 6 hours later take ibuprofen.  You can use Tylenol for fever and moderate pain, you can take this medication every 4-6 hours, please do not take more than 3000 mg in a 24-hour day.  You can take ibuprofen every 6 hours, do not take more than 2400 mg in a 24-hour day.  I advised that you do not take ibuprofen on an empty stomach, ibuprofen can cause GI problems such as GI bleeding.  Maintaining hydration status is very important, please drink at least 8 cups of water daily.  Please try to intake nutrient dense meals.

## 2023-01-28 IMAGING — DX DG CHEST 1V PORT
2 series · 2 of 2 positions shown · non-contrast
Comparison: 07/22/2015

CLINICAL DATA: Chest pain

EXAM:
PORTABLE CHEST 1 VIEW

[chest ap (1 of 2)]
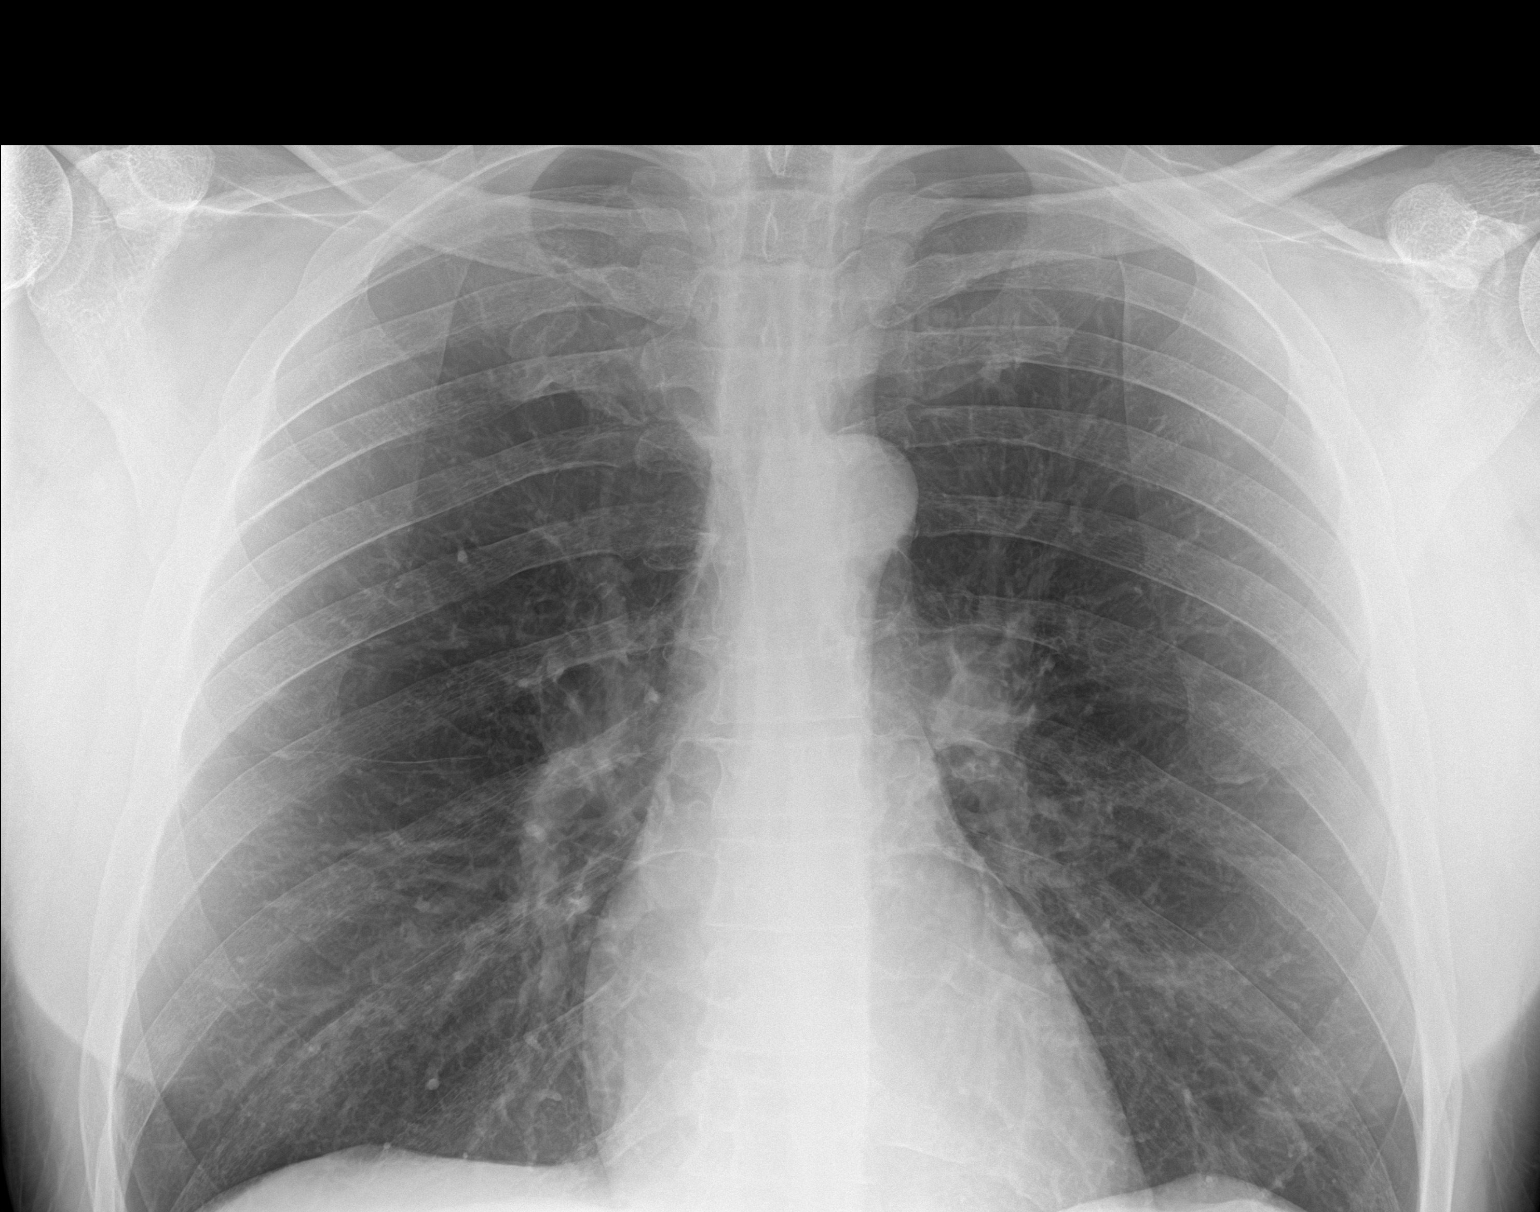

[chest ap (2 of 2)]
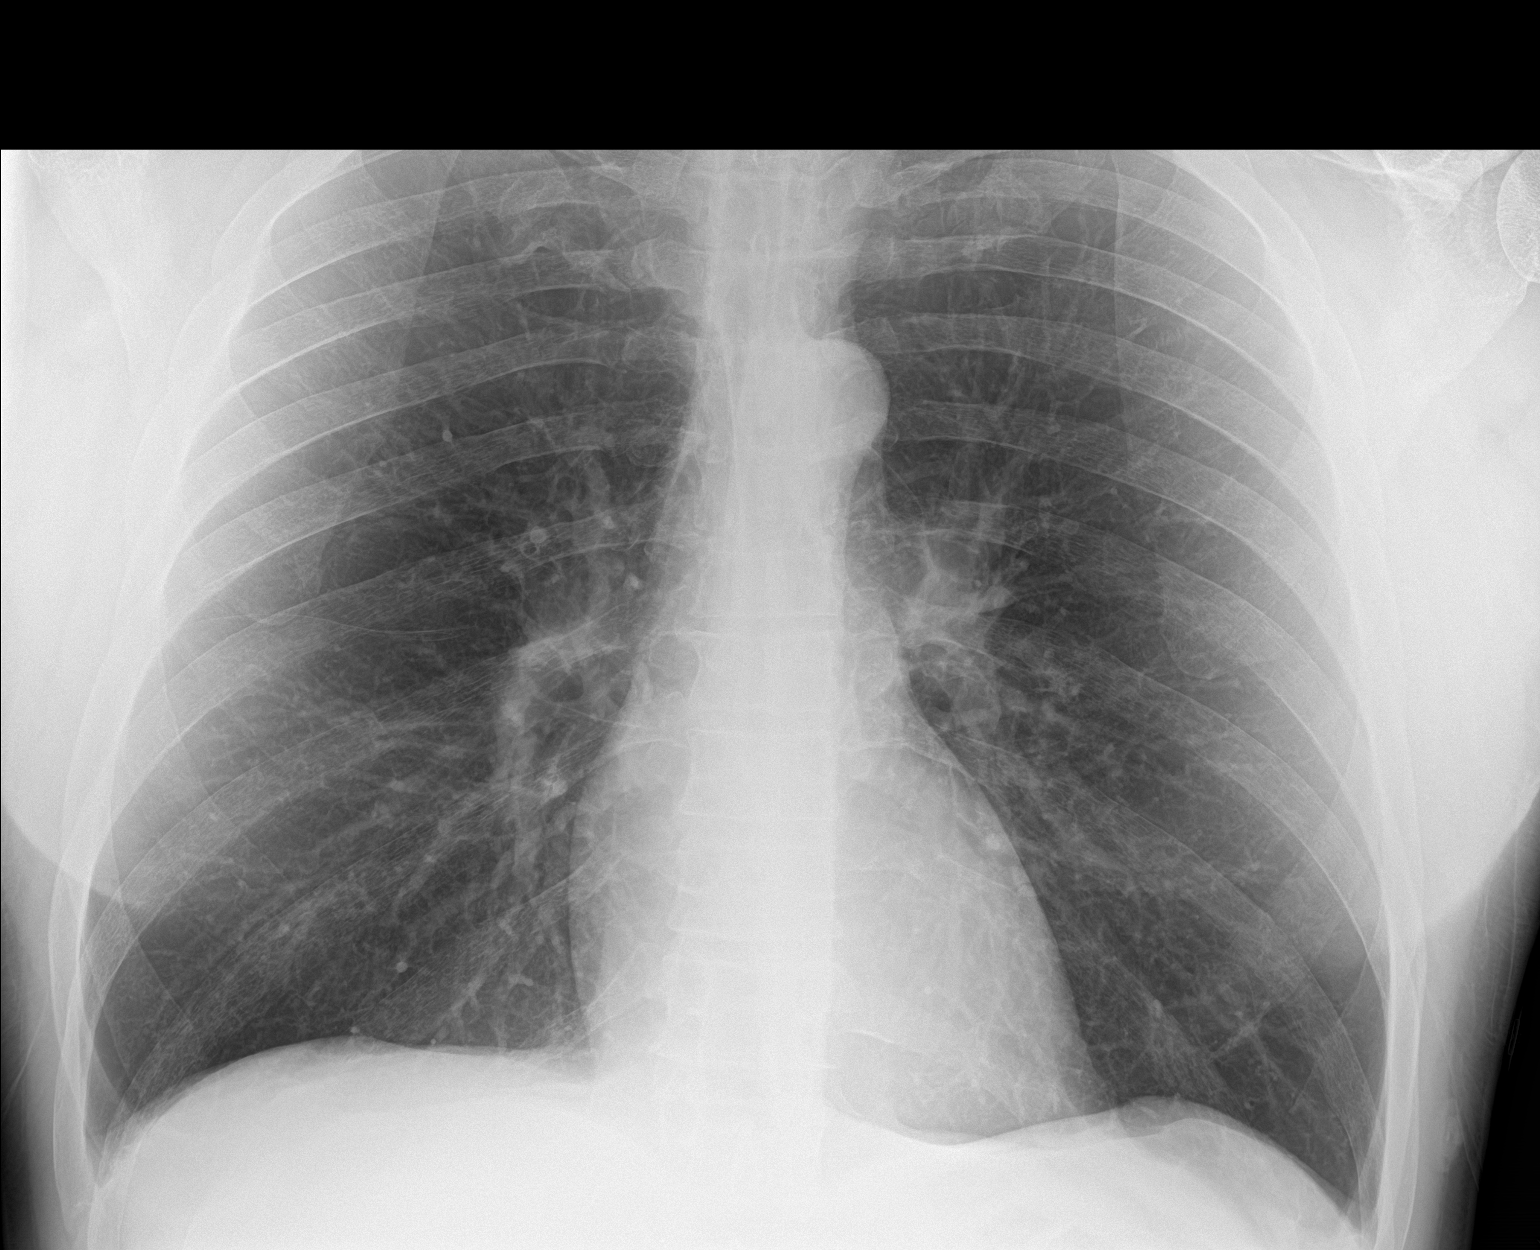

[2 of 2 positions shown; findings below may reference images not displayed]

FINDINGS: Cardiac size is within normal limits. There are no signs of
pulmonary edema or focal pulmonary consolidation. Low position of
diaphragms may be normal variation due to patient's body habitus or
suggest COPD. There is no pleural effusion or pneumothorax.
IMPRESSION: There are no signs of pulmonary edema or focal pulmonary
consolidation.
